# Patient Record
Sex: Female | Born: 1985 | Race: Black or African American | Hispanic: No | Marital: Single | Smoking: Current every day smoker
Health system: Southern US, Community
[De-identification: ages and names within clinical notes are randomized; demographics above are authoritative.]

## PROBLEM LIST (undated history)

## (undated) DIAGNOSIS — M25552 Pain in left hip: Secondary | ICD-10-CM

## (undated) DIAGNOSIS — G8929 Other chronic pain: Secondary | ICD-10-CM

## (undated) DIAGNOSIS — K029 Dental caries, unspecified: Secondary | ICD-10-CM

## (undated) DIAGNOSIS — F419 Anxiety disorder, unspecified: Secondary | ICD-10-CM

## (undated) DIAGNOSIS — M549 Dorsalgia, unspecified: Secondary | ICD-10-CM

## (undated) DIAGNOSIS — I10 Essential (primary) hypertension: Secondary | ICD-10-CM

## (undated) HISTORY — DX: Pain in left hip: M25.552

## (undated) HISTORY — DX: Other chronic pain: G89.29

## (undated) HISTORY — PX: TUBAL LIGATION: SHX77

## (undated) HISTORY — DX: Dorsalgia, unspecified: M54.9

## (undated) HISTORY — DX: Dental caries, unspecified: K02.9

## (undated) HISTORY — DX: Anxiety disorder, unspecified: F41.9

---

## 2017-06-10 ENCOUNTER — Emergency Department (HOSPITAL_COMMUNITY)
Admission: EM | Admit: 2017-06-10 | Discharge: 2017-06-10 | Disposition: A | Discharge status: Home / Self Care | Payer: Self-pay | Attending: Emergency Medicine | Admitting: Emergency Medicine

## 2017-06-10 ENCOUNTER — Encounter (HOSPITAL_COMMUNITY): Payer: Self-pay | Admitting: Emergency Medicine

## 2017-06-10 DIAGNOSIS — F17210 Nicotine dependence, cigarettes, uncomplicated: Secondary | ICD-10-CM | POA: Insufficient documentation

## 2017-06-10 DIAGNOSIS — M5416 Radiculopathy, lumbar region: Secondary | ICD-10-CM | POA: Insufficient documentation

## 2017-06-10 MED ORDER — CYCLOBENZAPRINE HCL 10 MG PO TABS: 10.0000 mg | ORAL_TABLET | Freq: Two times a day (BID) | ORAL | 0 refills | Status: DC | PRN

## 2017-06-10 MED ORDER — CYCLOBENZAPRINE HCL 10 MG PO TABS
10.0000 mg | ORAL_TABLET | Freq: Once | ORAL | Status: AC
  Administered 2017-06-10: 10 mg via ORAL
  Filled 2017-06-10: qty 1

## 2017-06-10 MED ORDER — PREDNISONE 20 MG PO TABS: 40.0000 mg | ORAL_TABLET | Freq: Every day | ORAL | 0 refills | Status: DC

## 2017-06-10 NOTE — ED Triage Notes (Signed)
C/o R upper leg pain since yesterday.  No known injury.  Denies swelling or any other symptoms.

## 2017-06-10 NOTE — ED Provider Notes (Signed)
[facility]-EMERGENCY DEPT Provider Note   CSN: [id number] Arrival date & time: 06/10/17  [id number]     History   Chief Complaint Chief Complaint  Patient presents with  . Leg Pain    HPI Destiny LawsStephanie Cook is a 31 y.o. female.  Patient presents to the emergency department with chief complaint of low back pain that radiates to her bilateral hips and thighs. She states that she has had the symptoms intermittently for a while, but states that symptoms recently worsened yesterday. She denies any fevers chills. Denies any new injuries. She denies any bowel or bladder incontinence. As a numbness, weakness, or tingling. Denies any IV drug use. Denies any history of cancer. There are no other associated symptoms.   The history is provided by the patient. No language interpreter was used.    History reviewed. No pertinent past medical history.  There are no active problems to display for this patient.   Past Surgical History:  Procedure Laterality Date  . CESAREAN SECTION      OB History    No data available       Home Medications    Prior to Admission medications   Medication Sig Start Date End Date Taking? Authorizing Provider  cyclobenzaprine (FLEXERIL) 10 MG tablet Take 1 tablet (10 mg total) by mouth 2 (two) times daily as needed for muscle spasms. 06/10/17   Roxy HorsemanBrowning, Kesa Birky, PA-C  predniSONE (DELTASONE) 20 MG tablet Take 2 tablets (40 mg total) by mouth daily. 06/10/17   Roxy HorsemanBrowning, Katiana Ruland, PA-C    Family History No family history on file.  Social History Social History  Substance Use Topics  . Smoking status: Current Every Day Smoker  . Smokeless tobacco: Never Used  . Alcohol use No     Allergies   Patient has no allergy information on record.   Review of Systems Review of Systems  Constitutional: Negative for chills and fever.  Gastrointestinal:       No bowel incontinence  Genitourinary:       No urinary incontinence  Musculoskeletal: Positive for arthralgias,  back pain and myalgias.  Neurological:       No saddle anesthesia     Physical Exam Updated Vital Signs BP 114/72 (BP Location: Left Arm)   Pulse 94   Temp 98.5 F (36.9 C) (Oral)   Resp 18   LMP 05/20/2017   SpO2 100%   Physical Exam Physical Exam  Constitutional: Pt appears well-developed and well-nourished. No distress.  HENT:  Head: Normocephalic and atraumatic.  Mouth/Throat: Oropharynx is clear and moist. No oropharyngeal exudate.  Eyes: Conjunctivae are normal.  Neck: Normal range of motion. Neck supple.  No meningismus Cardiovascular: Normal rate, regular rhythm and intact distal pulses.   Pulmonary/Chest: Effort normal and breath sounds normal. No respiratory distress. Pt has no wheezes.  Abdominal: Pt exhibits no distension Musculoskeletal:  Lumbar paraspinal muscles tender to palpation, no bony CTLS spine tenderness, deformity, step-off, or crepitus Lymphadenopathy: Pt has no cervical adenopathy.  Neurological: Pt is alert and oriented Speech is clear and goal oriented, follows commands Normal 5/5 strength in upper and lower extremities bilaterally including dorsiflexion and plantar flexion, strong and equal grip strength Sensation intact Great toe extension intact Moves extremities without ataxia, coordination intact Normal gait Normal balance Skin: Skin is warm and dry. No rash noted. Pt is not diaphoretic. No erythema.  Psychiatric: Pt has a normal mood and affect. Behavior is normal.  Nursing note and vitals reviewed.   ED Treatments /  Results  Labs (all labs ordered are listed, but only abnormal results are displayed) Labs Reviewed - No data to display  EKG  EKG Interpretation None       Radiology No results found.  Procedures Procedures (including critical care time)  Medications Ordered in ED Medications  cyclobenzaprine (FLEXERIL) tablet 10 mg (10 mg Oral Given 06/10/17 0547)     Initial Impression / Assessment and Plan / ED Course   I have reviewed the triage vital signs and the nursing notes.  Pertinent labs & imaging results that were available during my care of the patient were reviewed by me and considered in my medical decision making (see chart for details).     Patient with back pain.  No neurological deficits and normal neuro exam.  Patient is ambulatory.  No loss of bowel or bladder control.  Doubt cauda equina.  Denies fever,  doubt epidural abscess or other lesion. Recommend back exercises, stretching, RICE, and will treat with a short course of flexeril and prednisone.  Encouraged the patient that there could be a need for additional workup and/or imaging such as MRI, if the symptoms do not resolve. Patient advised that if the back pain does not resolve, or radiates, this could progress to more serious conditions and is encouraged to follow-up with PCP or orthopedics within 2 weeks.     Final Clinical Impressions(s) / ED Diagnoses   Final diagnoses:  Lumbar radiculopathy    New Prescriptions New Prescriptions   CYCLOBENZAPRINE (FLEXERIL) 10 MG TABLET    Take 1 tablet (10 mg total) by mouth 2 (two) times daily as needed for muscle spasms.   PREDNISONE (DELTASONE) 20 MG TABLET    Take 2 tablets (40 mg total) by mouth daily.     Roxy Horseman, PA-C 06/10/17 0554    Melene Plan, DO 06/10/17 [phone]

## 2017-11-18 ENCOUNTER — Encounter (HOSPITAL_COMMUNITY): Payer: Self-pay | Admitting: Emergency Medicine

## 2017-11-18 ENCOUNTER — Other Ambulatory Visit: Payer: Self-pay

## 2017-11-18 ENCOUNTER — Emergency Department (HOSPITAL_COMMUNITY)
Admission: EM | Admit: 2017-11-18 | Discharge: 2017-11-18 | Disposition: A | Discharge status: Home / Self Care | Payer: BLUE CROSS/BLUE SHIELD | Source: Home / Self Care

## 2017-11-18 ENCOUNTER — Emergency Department (HOSPITAL_COMMUNITY)
Admission: EM | Admit: 2017-11-18 | Discharge: 2017-11-18 | Disposition: A | Discharge status: Home / Self Care | Payer: BLUE CROSS/BLUE SHIELD | Attending: Emergency Medicine | Admitting: Emergency Medicine

## 2017-11-18 DIAGNOSIS — Y33XXXA Other specified events, undetermined intent, initial encounter: Secondary | ICD-10-CM | POA: Diagnosis not present

## 2017-11-18 DIAGNOSIS — R102 Pelvic and perineal pain: Secondary | ICD-10-CM | POA: Insufficient documentation

## 2017-11-18 DIAGNOSIS — T192XXA Foreign body in vulva and vagina, initial encounter: Secondary | ICD-10-CM | POA: Insufficient documentation

## 2017-11-18 DIAGNOSIS — Y939 Activity, unspecified: Secondary | ICD-10-CM | POA: Diagnosis not present

## 2017-11-18 DIAGNOSIS — Y929 Unspecified place or not applicable: Secondary | ICD-10-CM | POA: Diagnosis not present

## 2017-11-18 DIAGNOSIS — Z5321 Procedure and treatment not carried out due to patient leaving prior to being seen by health care provider: Secondary | ICD-10-CM | POA: Diagnosis not present

## 2017-11-18 DIAGNOSIS — Y998 Other external cause status: Secondary | ICD-10-CM | POA: Diagnosis not present

## 2017-11-18 LAB — URINALYSIS, ROUTINE W REFLEX MICROSCOPIC
Bilirubin Urine: NEGATIVE
Glucose, UA: NEGATIVE mg/dL
Ketones, ur: NEGATIVE mg/dL
Leukocytes, UA: NEGATIVE
NITRITE: NEGATIVE
Protein, ur: NEGATIVE mg/dL
SPECIFIC GRAVITY, URINE: 1.018 (ref 1.005–1.030)
pH: 5 (ref 5.0–8.0)

## 2017-11-18 LAB — CBC
HCT: 38.4 % (ref 36.0–46.0)
Hemoglobin: 12.8 g/dL (ref 12.0–15.0)
MCH: 27.2 pg (ref 26.0–34.0)
MCHC: 33.3 g/dL (ref 30.0–36.0)
MCV: 81.5 fL (ref 78.0–100.0)
PLATELETS: 281 10*3/uL (ref 150–400)
RBC: 4.71 MIL/uL (ref 3.87–5.11)
RDW: 13.9 % (ref 11.5–15.5)
WBC: 8.4 10*3/uL (ref 4.0–10.5)

## 2017-11-18 LAB — BASIC METABOLIC PANEL
ANION GAP: 7 (ref 5–15)
BUN: 6 mg/dL (ref 6–20)
CALCIUM: 8.8 mg/dL — AB (ref 8.9–10.3)
CO2: 22 mmol/L (ref 22–32)
Chloride: 108 mmol/L (ref 101–111)
Creatinine, Ser: 0.85 mg/dL (ref 0.44–1.00)
GFR calc Af Amer: >60 mL/min (ref >60)
GLUCOSE: 105 mg/dL — AB (ref 65–99)
POTASSIUM: 3.6 mmol/L (ref 3.5–5.1)
SODIUM: 137 mmol/L (ref 135–145)

## 2017-11-18 LAB — I-STAT BETA HCG BLOOD, ED (MC, WL, AP ONLY)

## 2017-11-18 LAB — PREGNANCY, URINE: PREG TEST UR: NEGATIVE

## 2017-11-18 MED ORDER — OXYCODONE-ACETAMINOPHEN 5-325 MG PO TABS
1.0000 | ORAL_TABLET | ORAL | Status: DC | PRN
  Administered 2017-11-18: 1 via ORAL
  Filled 2017-11-18: qty 1

## 2017-11-18 NOTE — ED Notes (Signed)
Pt thinks she has a tampon in her vagina because she has some reddish, pinkish discharge, some bleeding, and she is not on her period anymore.

## 2017-11-18 NOTE — ED Triage Notes (Signed)
Reports last period a week or so ago.  Now having vaginal pain and thinks there is a tampon left inside.  Reports using a douche and noting what looks like tissue paper coming out.  Also c/o rectal pain.

## 2017-11-18 NOTE — ED Notes (Signed)
Pt LWBS 

## 2017-11-18 NOTE — ED Notes (Signed)
Pt is at [place] ed 

## 2017-11-18 NOTE — ED Triage Notes (Addendum)
Patient is complaining of vaginal pain. Patient thinks she has a tampon still up in her per patient. Patient states she has bumps on her vagina. She also states her booty hurts.

## 2017-11-19 ENCOUNTER — Encounter (HOSPITAL_COMMUNITY): Payer: Self-pay | Admitting: Emergency Medicine

## 2017-11-19 ENCOUNTER — Emergency Department (HOSPITAL_COMMUNITY)
Admission: EM | Admit: 2017-11-19 | Discharge: 2017-11-19 | Disposition: A | Discharge status: Home / Self Care | Payer: BLUE CROSS/BLUE SHIELD | Attending: Emergency Medicine | Admitting: Emergency Medicine

## 2017-11-19 ENCOUNTER — Other Ambulatory Visit: Payer: Self-pay

## 2017-11-19 DIAGNOSIS — F17200 Nicotine dependence, unspecified, uncomplicated: Secondary | ICD-10-CM | POA: Diagnosis not present

## 2017-11-19 DIAGNOSIS — N938 Other specified abnormal uterine and vaginal bleeding: Secondary | ICD-10-CM | POA: Diagnosis not present

## 2017-11-19 DIAGNOSIS — Z79899 Other long term (current) drug therapy: Secondary | ICD-10-CM | POA: Diagnosis not present

## 2017-11-19 DIAGNOSIS — N899 Noninflammatory disorder of vagina, unspecified: Secondary | ICD-10-CM | POA: Diagnosis present

## 2017-11-19 LAB — URINALYSIS, ROUTINE W REFLEX MICROSCOPIC
BACTERIA UA: NONE SEEN
Bilirubin Urine: NEGATIVE
Glucose, UA: NEGATIVE mg/dL
Ketones, ur: NEGATIVE mg/dL
Leukocytes, UA: NEGATIVE
Nitrite: NEGATIVE
PROTEIN: 100 mg/dL — AB
Specific Gravity, Urine: 1.019 (ref 1.005–1.030)
WBC UA: NONE SEEN WBC/hpf (ref 0–5)
pH: 5 (ref 5.0–8.0)

## 2017-11-19 LAB — WET PREP, GENITAL
Sperm: NONE SEEN
TRICH WET PREP: NONE SEEN
YEAST WET PREP: NONE SEEN

## 2017-11-19 LAB — COMPREHENSIVE METABOLIC PANEL
ALBUMIN: 3.9 g/dL (ref 3.5–5.0)
ALK PHOS: 76 U/L (ref 38–126)
ALT: 14 U/L (ref 14–54)
ANION GAP: 8 (ref 5–15)
AST: 18 U/L (ref 15–41)
BUN: 10 mg/dL (ref 6–20)
CALCIUM: 8.4 mg/dL — AB (ref 8.9–10.3)
CO2: 22 mmol/L (ref 22–32)
Chloride: 106 mmol/L (ref 101–111)
Creatinine, Ser: 0.95 mg/dL (ref 0.44–1.00)
GFR calc Af Amer: >60 mL/min (ref >60)
GFR calc non Af Amer: >60 mL/min (ref >60)
GLUCOSE: 95 mg/dL (ref 65–99)
POTASSIUM: 3.6 mmol/L (ref 3.5–5.1)
SODIUM: 136 mmol/L (ref 135–145)
Total Bilirubin: 0.2 mg/dL — ABNORMAL LOW (ref 0.3–1.2)
Total Protein: 7.2 g/dL (ref 6.5–8.1)

## 2017-11-19 LAB — I-STAT BETA HCG BLOOD, ED (MC, WL, AP ONLY): I-stat hCG, quantitative: <5 m[IU]/mL (ref <5)

## 2017-11-19 LAB — CBC
HEMATOCRIT: 39.1 % (ref 36.0–46.0)
HEMOGLOBIN: 13.3 g/dL (ref 12.0–15.0)
MCH: 27.9 pg (ref 26.0–34.0)
MCHC: 34 g/dL (ref 30.0–36.0)
MCV: 82.1 fL (ref 78.0–100.0)
Platelets: 255 10*3/uL (ref 150–400)
RBC: 4.76 MIL/uL (ref 3.87–5.11)
RDW: 14 % (ref 11.5–15.5)
WBC: 7.8 10*3/uL (ref 4.0–10.5)

## 2017-11-19 LAB — LIPASE, BLOOD: Lipase: 32 U/L (ref 11–51)

## 2017-11-19 MED ORDER — IBUPROFEN 800 MG PO TABS: 800.0000 mg | ORAL_TABLET | Freq: Three times a day (TID) | ORAL | 0 refills | Status: DC | PRN

## 2017-11-19 MED ORDER — IBUPROFEN 400 MG PO TABS
600.0000 mg | ORAL_TABLET | Freq: Once | ORAL | Status: AC
  Administered 2017-11-19: 10:00:00 600 mg via ORAL
  Filled 2017-11-19: qty 1

## 2017-11-19 MED ORDER — OXYCODONE-ACETAMINOPHEN 5-325 MG PO TABS
1.0000 | ORAL_TABLET | ORAL | Status: DC | PRN
  Administered 2017-11-19: 1 via ORAL
  Filled 2017-11-19: qty 1

## 2017-11-19 NOTE — ED Notes (Signed)
Patient at desk wanting an update. This EMT told patient about wait and current status.

## 2017-11-19 NOTE — ED Provider Notes (Signed)
[facility]   CSN: [id number] Arrival date & time: 11/19/17  0200     History   Chief Complaint Chief Complaint  Patient presents with  . Foreign Body in Vagina    HPI Destiny Cook is a 31 y.o. female.  HPI  31 year old female presents with concern for a retained tampon in her vagina.  She states that she had a menstrual cycle from December 20-25.  She states that she douched 2 or 3 days ago and after this had some tissue come out of her vagina.  She has been having pain in her vagina since.  She also has had bleeding.  Some white discharge along with it.  The pain is her lower abdomen/vagina.  She denies any urinary symptoms.  No fevers or vomiting.  History reviewed. No pertinent past medical history.  There are no active problems to display for this patient.   Past Surgical History:  Procedure Laterality Date  . CESAREAN SECTION    . TUBAL LIGATION      OB History    No data available       Home Medications    Prior to Admission medications   Medication Sig Start Date End Date Taking? Authorizing Provider  cyclobenzaprine (FLEXERIL) 10 MG tablet Take 1 tablet (10 mg total) by mouth 2 (two) times daily as needed for muscle spasms. 06/10/17   Roxy HorsemanBrowning, Robert, PA-C  ibuprofen (ADVIL,MOTRIN) 800 MG tablet Take 1 tablet (800 mg total) by mouth every 8 (eight) hours as needed for moderate pain or cramping. 11/19/17   Pricilla LovelessGoldston, Ambree Frances, MD  predniSONE (DELTASONE) 20 MG tablet Take 2 tablets (40 mg total) by mouth daily. 06/10/17   Roxy HorsemanBrowning, Robert, PA-C    Family History History reviewed. No pertinent family history.  Social History Social History   Tobacco Use  . Smoking status: Current Every Day Smoker  . Smokeless tobacco: Never Used  Substance Use Topics  . Alcohol use: No  . Drug use: No     Allergies   Patient has no known allergies.   Review of Systems Review of Systems  Constitutional:  Negative for fever.  Gastrointestinal: Positive for abdominal pain. Negative for vomiting.  Genitourinary: Positive for vaginal bleeding, vaginal discharge and vaginal pain. Negative for dysuria.  All other systems reviewed and are negative.    Physical Exam Updated Vital Signs BP 113/68 (BP Location: Right Arm)   Pulse 97   Temp 98.8 F (37.1 C) (Oral)   Resp 16   Ht 5\' 4"  (1.626 m)   Wt 104.3 kg (230 lb)   LMP 11/09/2017   SpO2 100%   BMI 39.48 kg/m   Physical Exam  Constitutional: She is oriented to person, place, and time. She appears well-developed and well-nourished.  HENT:  Head: Normocephalic and atraumatic.  Right Ear: External ear normal.  Left Ear: External ear normal.  Nose: Nose normal.  Eyes: Right eye exhibits no discharge. Left eye exhibits no discharge.  Cardiovascular: Normal rate, regular rhythm and normal heart sounds.  Pulmonary/Chest: Effort normal and breath sounds normal.  Abdominal: Soft. There is tenderness (mild).    Genitourinary: There is no rash on the right labia. There is no rash on the left labia. Uterus is tender. Cervix exhibits no motion tenderness, no discharge and no friability. There is bleeding in the vagina.  Genitourinary Comments: No foreign body seen in the vagina including no tampon.  There is a small amount of bleeding  coming from the cervical loss.  No lacerations were seen in the vagina.  There is no discharge.  Mild uterine tenderness.  Neurological: She is alert and oriented to person, place, and time.  Skin: Skin is warm and dry.  Nursing note and vitals reviewed.    ED Treatments / Results  Labs (all labs ordered are listed, but only abnormal results are displayed) Labs Reviewed  WET PREP, GENITAL - Abnormal; Notable for the following components:      Result Value   Clue Cells Wet Prep HPF POC PRESENT (*)    WBC, Wet Prep HPF POC FEW (*)    All other components within normal limits  COMPREHENSIVE METABOLIC PANEL -  Abnormal; Notable for the following components:   Calcium 8.4 (*)    Total Bilirubin 0.2 (*)    All other components within normal limits  URINALYSIS, ROUTINE W REFLEX MICROSCOPIC - Abnormal; Notable for the following components:   APPearance HAZY (*)    Hgb urine dipstick LARGE (*)    Protein, ur 100 (*)    Squamous Epithelial / LPF 0-5 (*)    All other components within normal limits  LIPASE, BLOOD  CBC  I-STAT BETA HCG BLOOD, ED ([facility], [facility], [facility] ONLY)  GC/CHLAMYDIA PROBE AMP ([place]) NOT [facility]    EKG  EKG Interpretation None       Radiology No results found.  Procedures Procedures (including critical care time)  Medications Ordered in ED Medications  oxyCODONE-acetaminophen (PERCOCET/ROXICET) 5-325 MG per tablet 1 tablet (1 tablet Oral Given 11/19/17 0240)  ibuprofen (ADVIL,MOTRIN) tablet 600 mg (600 mg Oral Given 11/19/17 0940)     Initial Impression / Assessment and Plan / ED Course  I have reviewed the triage vital signs and the nursing notes.  Pertinent labs & imaging results that were available during my care of the patient were reviewed by me and considered in my medical decision making (see chart for details).     No retained foreign body seen.  Lab work obtained while she was in triage is unremarkable besides blood in her urine.  Her hemoglobin is normal.  She is not pregnant.  She has some mild uterine tenderness.  Given no foreign bodies or laceration seen, as well as seeing her vaginal bleeding come from her cervical loss, this appears to be dysfunctional uterine bleeding.  Discussed using NSAIDs and following up with OB/GYN. She has some clue cells on wet prep but has only mild discharge without foul or fishy odor. I doubt this is truly BV. REturn precautions.   Final Clinical Impressions(s) / ED Diagnoses   Final diagnoses:  Dysfunctional uterine bleeding    ED Discharge Orders        Ordered    ibuprofen (ADVIL,MOTRIN) 800 MG tablet  Every 8  hours PRN     11/19/17 1002       Pricilla LovelessGoldston, [place], MD 11/19/17 1021

## 2017-11-19 NOTE — ED Triage Notes (Signed)
Patient arrives with complaint of possible tampon lodged in vagina. States she was spotting yesterday and today she has begun to bleeding more. LMP: 12/25.

## 2017-11-19 NOTE — ED Notes (Signed)
Pt states she undertands instructions. Home stable with steady gait.

## 2017-11-19 NOTE — ED Notes (Signed)
Pt ambulates to room 38 with steady gait.

## 2017-11-20 LAB — GC/CHLAMYDIA PROBE AMP (~~LOC~~) NOT AT ARMC
CHLAMYDIA, DNA PROBE: NEGATIVE
Neisseria Gonorrhea: NEGATIVE

## 2018-11-26 ENCOUNTER — Encounter (HOSPITAL_BASED_OUTPATIENT_CLINIC_OR_DEPARTMENT_OTHER): Payer: Self-pay | Admitting: Emergency Medicine

## 2018-11-26 ENCOUNTER — Emergency Department (HOSPITAL_BASED_OUTPATIENT_CLINIC_OR_DEPARTMENT_OTHER)
Admission: EM | Admit: 2018-11-26 | Discharge: 2018-11-26 | Disposition: A | Discharge status: Home / Self Care | Payer: BLUE CROSS/BLUE SHIELD | Attending: Emergency Medicine | Admitting: Emergency Medicine

## 2018-11-26 ENCOUNTER — Emergency Department (HOSPITAL_BASED_OUTPATIENT_CLINIC_OR_DEPARTMENT_OTHER): Payer: BLUE CROSS/BLUE SHIELD

## 2018-11-26 ENCOUNTER — Other Ambulatory Visit: Payer: Self-pay

## 2018-11-26 DIAGNOSIS — M25562 Pain in left knee: Secondary | ICD-10-CM | POA: Diagnosis not present

## 2018-11-26 DIAGNOSIS — M25552 Pain in left hip: Secondary | ICD-10-CM | POA: Diagnosis present

## 2018-11-26 DIAGNOSIS — F17200 Nicotine dependence, unspecified, uncomplicated: Secondary | ICD-10-CM | POA: Diagnosis not present

## 2018-11-26 LAB — PREGNANCY, URINE: PREG TEST UR: NEGATIVE

## 2018-11-26 MED ORDER — HYDROCODONE-ACETAMINOPHEN 5-325 MG PO TABS
1.0000 | ORAL_TABLET | Freq: Once | ORAL | Status: AC
  Administered 2018-11-26: 1 via ORAL
  Filled 2018-11-26: qty 1

## 2018-11-26 NOTE — ED Triage Notes (Signed)
Reports left leg injury today after falling down steps.  Unable to locate on specific location.  States "my whole leg hurts".

## 2018-11-26 NOTE — Discharge Instructions (Addendum)
Evaluated today for knee pain after fall.  Your x-rays were negative.  We have placed you in a knee sleeve and given you crutches.  Please make sure to ice, elevate and rest your leg.  You may take Tylenol and ibuprofen as needed for your pain.  Follow-up with your PCP for reevaluation if you continue to have pain.

## 2018-11-26 NOTE — ED Provider Notes (Signed)
[facility] EMERGENCY DEPARTMENT Provider Note   CSN: [id number] Arrival date & time: 11/26/18  1841   History   Chief Complaint Chief Complaint  Patient presents with  . Leg Injury    HPI Destiny Cook is a 33 y.o. female with no significant past medical history who presents for evaluation of left leg pain.  Patient states she was walking up the stairs when her left foot caught the lip of the stairs and she fell on her left side.  Patient states she was able to ambulate to work this morning after the incident occurred.  Has had pain to her left hip and left knee since incident.  Rates her pain an 8/10.  Denies radiation of pain, fever, chills, nausea, vomiting, low back pain, numbness or tingling in her extremities, decreased range of motion, swelling, warmth or redness. Has not take anything for symptoms PTA.  Denies additional aggravating or alleviating factors.  Denies hitting her head or loss of consciousness.  Did not have any preceding dizziness or lightheadedness.  History provided by patient.  No interpreter was used.  HPI  History reviewed. No pertinent past medical history.  There are no active problems to display for this patient.   Past Surgical History:  Procedure Laterality Date  . CESAREAN SECTION    . TUBAL LIGATION       OB History   No obstetric history on file.      Home Medications    Prior to Admission medications   Medication Sig Start Date End Date Taking? Authorizing Provider  cyclobenzaprine (FLEXERIL) 10 MG tablet Take 1 tablet (10 mg total) by mouth 2 (two) times daily as needed for muscle spasms. 06/10/17   Roxy HorsemanBrowning, Robert, PA-C  ibuprofen (ADVIL,MOTRIN) 800 MG tablet Take 1 tablet (800 mg total) by mouth every 8 (eight) hours as needed for moderate pain or cramping. 11/19/17   Pricilla LovelessGoldston, Scott, MD  predniSONE (DELTASONE) 20 MG tablet Take 2 tablets (40 mg total) by mouth daily. 06/10/17   Roxy HorsemanBrowning, Robert, PA-C    Family  History History reviewed. No pertinent family history.  Social History Social History   Tobacco Use  . Smoking status: Current Every Day Smoker  . Smokeless tobacco: Never Used  Substance Use Topics  . Alcohol use: No  . Drug use: No     Allergies   Patient has no known allergies.   Review of Systems Review of Systems  Constitutional: Negative.   HENT: Negative.   Respiratory: Negative.   Cardiovascular: Negative.   Gastrointestinal: Negative.   Genitourinary: Negative.   Musculoskeletal: Negative for back pain, gait problem, joint swelling, neck pain and neck stiffness.       Left hip and left knee pain.  Skin: Negative.   Neurological: Negative.   All other systems reviewed and are negative.    Physical Exam Updated Vital Signs BP (!) 151/87   Pulse (!) 111   Temp 98.2 F (36.8 C) (Oral)   Resp 16   Ht 5\' 3"  (1.6 m)   Wt 108.9 kg   LMP 11/12/2018 (Approximate) Comment: pregnancy test performed  SpO2 100%   BMI 42.51 kg/m   Physical Exam Vitals signs and nursing note reviewed.  Constitutional:      General: She is not in acute distress.    Appearance: She is well-developed. She is not ill-appearing, toxic-appearing or diaphoretic.  HENT:     Head: Normocephalic and atraumatic.     Nose: Nose normal. No congestion  or rhinorrhea.     Mouth/Throat:     Mouth: Mucous membranes are moist.  Eyes:     Pupils: Pupils are equal, round, and reactive to light.  Neck:     Musculoskeletal: Normal range of motion.  Cardiovascular:     Rate and Rhythm: Normal rate.  Pulmonary:     Effort: No respiratory distress.  Abdominal:     General: There is no distension.  Musculoskeletal: Normal range of motion.     Comments: No tenderness to palpation over bilateral hips or pelvis.  She does have mild tenderness palpation over lateral medial joint lines.  No tenderness to patella.  Patient with full range of motion bilateral lower extremities without difficulty.   Negative varus, valgus stress.  Negative anterior drawer to the left knee.  No bony tenderness to tibia, fibula.  No midline lower back pain.  Full range of motion to lumbar spine. Lower Extremity compartments are soft.  Skin:    General: Skin is warm and dry.     Comments: No edema, erythema, ecchymosis or warmth.  Neurological:     Mental Status: She is alert.     Comments: 5/5 strength to bilateral lower extremities.  Able to ambulate in department, however has pain.      ED Treatments / Results  Labs (all labs ordered are listed, but only abnormal results are displayed) Labs Reviewed  PREGNANCY, URINE    EKG None  Radiology Dg Knee Complete 4 Views Left  Result Date: 11/26/2018 CLINICAL DATA:  Post fall down 7 steps this morning now with left knee pain. EXAM: LEFT KNEE - COMPLETE 4+ VIEW COMPARISON:  None. FINDINGS: No fracture or dislocation. Joint spaces are preserved. No evidence of chondrocalcinosis. No definite knee joint effusion. Regional soft tissues appear normal. IMPRESSION: No explanation for patient's left knee pain. Electronically Signed   By: Simonne Come M.D.   On: 11/26/2018 20:17   Dg Hip Unilat W Or Wo Pelvis 2-3 Views Left  Result Date: 11/26/2018 CLINICAL DATA:  Post fall down 7 steps now with left hip pain. EXAM: DG HIP (WITH OR WITHOUT PELVIS) 2-3V LEFT COMPARISON:  None. FINDINGS: No fracture or dislocation. 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 Limited visualization of the pelvis is normal. Suspected similar-appearing mild degenerative change of the contralateral right hip, incompletely evaluated. Phleboliths overlie the lower pelvis bilaterally. Linear radiopaque structure overlying the sacrum may be external to the patient. IMPRESSION: 1. No acute findings. 2. 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 Electronically Signed   By: Simonne Come M.D.   On: 11/26/2018 20:18    Procedures Procedures (including critical  care time)  Medications Ordered in ED Medications  HYDROcodone-acetaminophen (NORCO/VICODIN) 5-325 MG per tablet 1 tablet (1 tablet Oral Given 11/26/18 1948)     Initial Impression / Assessment and Plan / ED Course  I have reviewed the triage vital signs and the nursing notes.  Pertinent labs & imaging results that were available during my care of the patient were reviewed by me and considered in my medical decision making (see chart for details).  33 year old female presents for evaluation after mechanical fall.  Pain to left hip and left knee.  Normal musculoskeletal exam.  Neurovascularly intact.  She does have mild tenderness to lateral and medial joint lines, however has negative varus, valgus stress.  Negative anterior drawer sign.  Able to ambulate after incident.  Has been able to ambulate department without  difficulty.  No edema, erythema, ecchymosis or warmth.  No midline back pain.  Full range of motion to lumbar spine.  Did not hit head or lose conscious when she fell.  Extremity compartments are soft.  Will obtain plain film left hip and left knee and reevaluate.  Plain film negative for fracture or dislocation.  No suspicion for occult fracture.  Patient most likely with musculoskeletal sprain, strain.  Will place patient in knee sleeve and provide crutches.  Low suspicion for emergent pathology causing patient's symptoms at this time.  Patient is hemodynamically stable and appropriate for DC home at this time.  Will have patient follow-up with PCP for reevaluation if she continues to have symptoms.  Discussed RICE for symptomatic management.  Patient voiced understanding of return precautions and is agreeable for follow-up.    Final Clinical Impressions(s) / ED Diagnoses   Final diagnoses:  Left hip pain  Acute pain of left knee    ED Discharge Orders    None       Shavana Calder A, PA-C 11/27/18 0013    Jacalyn Lefevre, MD 11/27/18 0018

## 2018-12-02 ENCOUNTER — Encounter (HOSPITAL_COMMUNITY): Payer: Self-pay | Admitting: Emergency Medicine

## 2018-12-02 ENCOUNTER — Other Ambulatory Visit: Payer: Self-pay

## 2018-12-02 ENCOUNTER — Emergency Department (HOSPITAL_COMMUNITY)
Admission: EM | Admit: 2018-12-02 | Discharge: 2018-12-02 | Disposition: A | Discharge status: Home / Self Care | Payer: BLUE CROSS/BLUE SHIELD | Attending: Emergency Medicine | Admitting: Emergency Medicine

## 2018-12-02 DIAGNOSIS — F17200 Nicotine dependence, unspecified, uncomplicated: Secondary | ICD-10-CM | POA: Insufficient documentation

## 2018-12-02 DIAGNOSIS — K0889 Other specified disorders of teeth and supporting structures: Secondary | ICD-10-CM | POA: Diagnosis present

## 2018-12-02 DIAGNOSIS — K029 Dental caries, unspecified: Secondary | ICD-10-CM | POA: Diagnosis not present

## 2018-12-02 MED ORDER — MELOXICAM 7.5 MG PO TABS: 7.5000 mg | ORAL_TABLET | Freq: Every day | ORAL | 0 refills | Status: AC

## 2018-12-02 MED ORDER — PENICILLIN V POTASSIUM 500 MG PO TABS: 500.0000 mg | ORAL_TABLET | Freq: Four times a day (QID) | ORAL | 0 refills | Status: AC

## 2018-12-02 NOTE — ED Triage Notes (Signed)
Pt. Stated, I started having a toothache 2 days ago.

## 2018-12-02 NOTE — ED Notes (Signed)
Pt. Refused to sign her departure signature.

## 2018-12-02 NOTE — Discharge Instructions (Addendum)
Take penicillin as prescribed and complete the full course. Take meloxicam as needed as prescribed for pain. Follow-up with a dentist as soon as possible. Continue to brush teeth with a soft toothbrush, rinse and spit with Listerine or salt water after every meal.

## 2018-12-02 NOTE — ED Provider Notes (Signed)
[facility] EMERGENCY DEPARTMENT Provider Note   CSN: [id number] Arrival date & time: 12/02/18  1111     History   Chief Complaint Chief Complaint  Patient presents with  . Dental Pain    HPI Destiny Cook is a 33 y.o. female.  33yo female presents with complaint of right lower tooth pain x 2 days.  Denies trauma, drainage, fevers.  Patient has known dental disease.  No other complaints or concerns.     History reviewed. No pertinent past medical history.  There are no active problems to display for this patient.   Past Surgical History:  Procedure Laterality Date  . CESAREAN SECTION    . TUBAL LIGATION       OB History   No obstetric history on file.      Home Medications    Prior to Admission medications   Medication Sig Start Date End Date Taking? Authorizing Provider  meloxicam (MOBIC) 7.5 MG tablet Take 1 tablet (7.5 mg total) by mouth daily for 10 days. 12/02/18 12/12/18  Army Melia A, PA-C  penicillin v potassium (VEETID) 500 MG tablet Take 1 tablet (500 mg total) by mouth 4 (four) times daily for 7 days. 12/02/18 12/09/18  Jeannie Fend, PA-C    Family History No family history on file.  Social History Social History   Tobacco Use  . Smoking status: Current Every Day Smoker  . Smokeless tobacco: Never Used  Substance Use Topics  . Alcohol use: No  . Drug use: No     Allergies   Patient has no known allergies.   Review of Systems Review of Systems  Constitutional: Negative for chills and fever.  HENT: Positive for dental problem. Negative for facial swelling, trouble swallowing and voice change.   Gastrointestinal: Negative for vomiting.  Musculoskeletal: Negative for neck pain and neck stiffness.  Skin: Negative for rash and wound.  Allergic/Immunologic: Negative for immunocompromised state.  Neurological: Negative for headaches.  Hematological: Negative for adenopathy.  Psychiatric/Behavioral: Negative for  confusion.  All other systems reviewed and are negative.    Physical Exam Updated Vital Signs BP 124/65 (BP Location: Right Arm)   Pulse 100   Temp 98.4 F (36.9 C) (Oral)   Resp 18   Ht 5\' 3"  (1.6 m)   Wt 108.8 kg   LMP 11/12/2018 (Approximate) Comment: pregnancy test performed  SpO2 98%   BMI 42.49 kg/m   Physical Exam Vitals signs and nursing note reviewed.  Constitutional:      General: She is not in acute distress.    Appearance: She is well-developed. She is not diaphoretic.  HENT:     Head: Normocephalic and atraumatic.     Nose: Nose normal.     Mouth/Throat:     Mouth: Mucous membranes are moist.     Pharynx: No oropharyngeal exudate or posterior oropharyngeal erythema.   Neck:     Musculoskeletal: Neck supple.  Pulmonary:     Effort: Pulmonary effort is normal.  Lymphadenopathy:     Cervical: No cervical adenopathy.  Skin:    General: Skin is warm and dry.     Findings: No erythema or rash.  Neurological:     Mental Status: She is alert and oriented to person, place, and time.  Psychiatric:        Behavior: Behavior normal.      ED Treatments / Results  Labs (all labs ordered are listed, but only abnormal results are displayed) Labs Reviewed -  No data to display  EKG None  Radiology No results found.  Procedures Procedures (including critical care time)  Medications Ordered in ED Medications - No data to display   Initial Impression / Assessment and Plan / ED Course  I have reviewed the triage vital signs and the nursing notes.  Pertinent labs & imaging results that were available during my care of the patient were reviewed by me and considered in my medical decision making (see chart for details).    Final Clinical Impressions(s) / ED Diagnoses   Final diagnoses:  Dental caries    ED Discharge Orders         Ordered    penicillin v potassium (VEETID) 500 MG tablet  4 times daily     12/02/18 1128    meloxicam (MOBIC) 7.5 MG  tablet  Daily     12/02/18 1128           Alden HippMurphy,  A, PA-C 12/02/18 1131    Sabas SousBero, Michael M, MD 12/03/18 1040

## 2018-12-13 ENCOUNTER — Emergency Department (HOSPITAL_BASED_OUTPATIENT_CLINIC_OR_DEPARTMENT_OTHER)
Admission: EM | Admit: 2018-12-13 | Discharge: 2018-12-13 | Disposition: A | Discharge status: Home / Self Care | Payer: BLUE CROSS/BLUE SHIELD | Attending: Emergency Medicine | Admitting: Emergency Medicine

## 2018-12-13 ENCOUNTER — Other Ambulatory Visit: Payer: Self-pay

## 2018-12-13 ENCOUNTER — Encounter (HOSPITAL_BASED_OUTPATIENT_CLINIC_OR_DEPARTMENT_OTHER): Payer: Self-pay | Admitting: Emergency Medicine

## 2018-12-13 DIAGNOSIS — F17200 Nicotine dependence, unspecified, uncomplicated: Secondary | ICD-10-CM | POA: Diagnosis not present

## 2018-12-13 DIAGNOSIS — K0889 Other specified disorders of teeth and supporting structures: Secondary | ICD-10-CM

## 2018-12-13 DIAGNOSIS — K029 Dental caries, unspecified: Secondary | ICD-10-CM

## 2018-12-13 MED ORDER — NAPROXEN 375 MG PO TABS: 375.0000 mg | ORAL_TABLET | Freq: Two times a day (BID) | ORAL | 0 refills | Status: DC

## 2018-12-13 MED ORDER — HYDROCODONE-ACETAMINOPHEN 5-325 MG PO TABS
2.0000 | ORAL_TABLET | Freq: Once | ORAL | Status: AC
  Administered 2018-12-13: 2 via ORAL
  Filled 2018-12-13: qty 2

## 2018-12-13 MED ORDER — AMOXICILLIN-POT CLAVULANATE 875-125 MG PO TABS: 1.0000 | ORAL_TABLET | Freq: Two times a day (BID) | ORAL | 0 refills | Status: AC

## 2018-12-13 NOTE — ED Provider Notes (Signed)
[facility] EMERGENCY DEPARTMENT Provider Note   CSN: [id number] Arrival date & time: 12/13/18  2119     History   Chief Complaint Chief Complaint  Patient presents with  . Dental Pain    HPI Destiny Cook is a 33 y.o. female for evaluation of right lower dental pain that is been ongoing for last several weeks.  Patient was seen in the 12/2 0 for evaluation of same symptoms.  At that time, she was discharged with penicillin and meloxicam which patient states she has been taking.  Patient reports that she continues to have pain to the right lower teeth.  She states she has taken Tylenol, ibuprofen without any relief from symptoms.  Patient states that she has been able to tolerate secretions, p.o.  She states she feels like her face is swollen but has not noticed any overlying warmth, erythema, edema.  Patient states she has not noted any fever.  Patient reports that she just moved here from [place] and does not have a dentist that she follows up with.  Patient denies any difficulty swallowing, difficulty breathing, vomiting, fevers.  The history is provided by the patient.    History reviewed. No pertinent past medical history.  There are no active problems to display for this patient.   Past Surgical History:  Procedure Laterality Date  . CESAREAN SECTION    . TUBAL LIGATION       OB History   No obstetric history on file.      Home Medications    Prior to Admission medications   Medication Sig Start Date End Date Taking? Authorizing Provider  amoxicillin-clavulanate (AUGMENTIN) 875-125 MG tablet Take 1 tablet by mouth 2 (two) times daily for 7 days. 12/13/18 12/20/18  Maxwell Caul, PA-C  naproxen (NAPROSYN) 375 MG tablet Take 1 tablet (375 mg total) by mouth 2 (two) times daily. 12/13/18   Maxwell Caul, PA-C    Family History History reviewed. No pertinent family history.  Social History Social History   Tobacco Use  . Smoking status: Current  Every Day Smoker  . Smokeless tobacco: Never Used  Substance Use Topics  . Alcohol use: No  . Drug use: No     Allergies   Patient has no known allergies.   Review of Systems Review of Systems  Constitutional: Negative for fever.  HENT: Positive for dental problem and facial swelling. Negative for drooling and trouble swallowing.   Respiratory: Negative for shortness of breath.   Gastrointestinal: Negative for vomiting.  All other systems reviewed and are negative.    Physical Exam Updated Vital Signs BP (!) 132/102 (BP Location: Left Arm)   Pulse 97   Temp 98.2 F (36.8 C) (Oral)   Resp 18   Ht 5\' 3"  (1.6 m)   Wt 104.3 kg   LMP 12/10/2018 (Exact Date)   SpO2 100%   BMI 40.74 kg/m   Physical Exam Vitals signs and nursing note reviewed.  Constitutional:      Appearance: She is well-developed.  HENT:     Head: Normocephalic and atraumatic.     Comments: Face is symmetric in appearance without any overlying warmth, erythema, edema.    Mouth/Throat:     Mouth: Mucous membranes are moist.     Pharynx: Oropharynx is clear. Uvula midline.      Comments: Airway is patent, phonation is intact. Uvula is midline.  No trismus.  Diffuse dental caries noted throughout. Eyes:     General: No  scleral icterus.       Right eye: No discharge.        Left eye: No discharge.     Conjunctiva/sclera: Conjunctivae normal.  Pulmonary:     Effort: Pulmonary effort is normal.  Skin:    General: Skin is warm and dry.  Neurological:     Mental Status: She is alert.  Psychiatric:        Speech: Speech normal.        Behavior: Behavior normal.      ED Treatments / Results  Labs (all labs ordered are listed, but only abnormal results are displayed) Labs Reviewed - No data to display  EKG None  Radiology No results found.  Procedures Procedures (including critical care time)  Medications Ordered in ED Medications  HYDROcodone-acetaminophen (NORCO/VICODIN) 5-325 MG per  tablet 2 tablet (2 tablets Oral Given 12/13/18 2331)     Initial Impression / Assessment and Plan / ED Course  I have reviewed the triage vital signs and the nursing notes.  Pertinent labs & imaging results that were available during my care of the patient were reviewed by me and considered in my medical decision making (see chart for details).     33 y.o. F  presents with a few weeks  of dental pain.  Seen here in the ED previously for same symptoms.  Patient reports she was not provide any dental resources.  Comes today because she continues to have pain.  She states she may has noticed some slight facial swelling but no fevers, difficulty breathing, difficulty tolerating p.o. Vital signs reviewed and stable. Patient is afebrile, non-toxic appearing, sitting comfortably on examination table.  Exam, she has diffuse dental caries noted throughout.  It appears as her right lower wisdom tooth has erupted.  Patient with partially cracked tooth #32 with surrounding gingival decay.  No surrounding face is symmetric in appearance without any swelling.  No obvious abscess that would require drainage here in the ED. Exam not concerning for Ludwig's angina or pharyngeal abscess.  Offered patient dental block here in the ED but she declined.  Will give dose of pain medications here in the emergency department.  Review of her records show that she had been seen previously and was discharged home with penicillin.  We will plan to increase antibiotic to Augmentin as well as send her home on some Naprosyn. Patient instructed to follow-up with dentist referral provided. At this time, patient exhibits no emergent life-threatening condition that require further evaluation in ED or admission. Stable for discharge at this time. Strict return precautions discussed. Patient expresses understanding and agreement to plan.  Portions of this note were generated with Scientist, clinical (histocompatibility and immunogenetics)Dragon dictation software. Dictation errors may occur despite  best attempts at proofreading.  Final Clinical Impressions(s) / ED Diagnoses   Final diagnoses:  Dental caries  Pain, dental    ED Discharge Orders         Ordered    amoxicillin-clavulanate (AUGMENTIN) 875-125 MG tablet  2 times daily     12/13/18 2329    naproxen (NAPROSYN) 375 MG tablet  2 times daily     12/13/18 2329           Rosana HoesLayden,  A, PA-C 12/14/18 0017    Arby BarrettePfeiffer, Marcy, MD 12/17/18 [phone]

## 2018-12-13 NOTE — Discharge Instructions (Signed)
Take antibiotics as directed. Please take all of your antibiotics until finished.  You can take Tylenol or Ibuprofen as directed for pain. You can alternate Tylenol and Ibuprofen every 4 hours. If you take Tylenol at 1pm, then you can take Ibuprofen at 5pm. Then you can take Tylenol again at 9pm.   The exam and treatment you received today has been provided on an emergency basis only. This is not a substitute for complete medical or dental care. If your problem worsens or new symptoms (problems) appear, and you are unable to arrange prompt follow-up care with your dentist, call or return to this location. If you do not have a dentist, please follow-up with one on the list provided  CALL YOUR DENTIST OR RETURN IMMEDIATELY IF you develop a fever, rash, difficulty breathing or swallowing, neck or facial swelling, or other potentially serious concerns.   Please follow-up with one of the dental clinics provided to you below or in your paperwork. Call and tell them you were seen in the Emergency Dept and arrange for an appointment. You may have to call multiple places in order to find a place to be seen.  Dental Assistance If the dentist on-call cannot see you, please use the resources below:   Patients with Medicaid: [facility] [address], [phone], [phone]  If unable to pay, or uninsured, contact [facility] ([phone]) or [facility] ([phone] in [place], [phone] in [place]) to become qualified for the adult dental clinic  Other Low-Cost Community Dental Services: [facility]- [address], [place], [place], [address]    [phone], Ext. [phone]    2nd and 4th Thursday of the month at 6:30am    10 clients each day by appointment, can sometimes see walk-in     patients if someone does not show for an appointment [facility]- [address], [place], [place], [address]    [phone]  [facility]- [address], [place], [place], [address]    [phone]  [facility]- [phone] [facility]- [phone] [facility]- [phone]  

## 2018-12-13 NOTE — ED Notes (Signed)
Pt given dental resource guide. Pt very tearful on assessment.

## 2018-12-13 NOTE — ED Notes (Signed)
Pt unable to voice daily medication on at this time. Pt very tearful and only answering yes/no questions.

## 2018-12-13 NOTE — ED Triage Notes (Signed)
Pt is c/o toothache on the right bottom  Pt states she has been taking antibiotics without any relief

## 2019-03-12 ENCOUNTER — Emergency Department (HOSPITAL_BASED_OUTPATIENT_CLINIC_OR_DEPARTMENT_OTHER): Payer: BLUE CROSS/BLUE SHIELD

## 2019-03-12 ENCOUNTER — Other Ambulatory Visit: Payer: Self-pay

## 2019-03-12 ENCOUNTER — Emergency Department (HOSPITAL_BASED_OUTPATIENT_CLINIC_OR_DEPARTMENT_OTHER)
Admission: EM | Admit: 2019-03-12 | Discharge: 2019-03-12 | Disposition: A | Discharge status: Home / Self Care | Payer: BLUE CROSS/BLUE SHIELD | Attending: Emergency Medicine | Admitting: Emergency Medicine

## 2019-03-12 ENCOUNTER — Encounter (HOSPITAL_BASED_OUTPATIENT_CLINIC_OR_DEPARTMENT_OTHER): Payer: Self-pay | Admitting: Emergency Medicine

## 2019-03-12 DIAGNOSIS — N760 Acute vaginitis: Secondary | ICD-10-CM | POA: Insufficient documentation

## 2019-03-12 DIAGNOSIS — F17200 Nicotine dependence, unspecified, uncomplicated: Secondary | ICD-10-CM | POA: Diagnosis not present

## 2019-03-12 DIAGNOSIS — B9689 Other specified bacterial agents as the cause of diseases classified elsewhere: Secondary | ICD-10-CM | POA: Diagnosis not present

## 2019-03-12 DIAGNOSIS — R1030 Lower abdominal pain, unspecified: Secondary | ICD-10-CM

## 2019-03-12 LAB — URINALYSIS, ROUTINE W REFLEX MICROSCOPIC
Bilirubin Urine: NEGATIVE
Glucose, UA: NEGATIVE mg/dL
Hgb urine dipstick: NEGATIVE
Ketones, ur: NEGATIVE mg/dL
Leukocytes,Ua: NEGATIVE
Nitrite: NEGATIVE
Protein, ur: NEGATIVE mg/dL
Specific Gravity, Urine: 1.015 (ref 1.005–1.030)
pH: 6 (ref 5.0–8.0)

## 2019-03-12 LAB — CBC WITH DIFFERENTIAL/PLATELET
Abs Immature Granulocytes: 0.01 10*3/uL (ref 0.00–0.07)
Basophils Absolute: 0 10*3/uL (ref 0.0–0.1)
Basophils Relative: 1 %
Eosinophils Absolute: 0.3 10*3/uL (ref 0.0–0.5)
Eosinophils Relative: 3 %
HCT: 37.4 % (ref 36.0–46.0)
Hemoglobin: 12.1 g/dL (ref 12.0–15.0)
Immature Granulocytes: 0 %
Lymphocytes Relative: 29 %
Lymphs Abs: 2.2 10*3/uL (ref 0.7–4.0)
MCH: 26.9 pg (ref 26.0–34.0)
MCHC: 32.4 g/dL (ref 30.0–36.0)
MCV: 83.3 fL (ref 80.0–100.0)
Monocytes Absolute: 0.5 10*3/uL (ref 0.1–1.0)
Monocytes Relative: 6 %
Neutro Abs: 4.7 10*3/uL (ref 1.7–7.7)
Neutrophils Relative %: 61 %
Platelets: 248 10*3/uL (ref 150–400)
RBC: 4.49 MIL/uL (ref 3.87–5.11)
RDW: 13.4 % (ref 11.5–15.5)
WBC: 7.7 10*3/uL (ref 4.0–10.5)
nRBC: 0 % (ref 0.0–0.2)

## 2019-03-12 LAB — COMPREHENSIVE METABOLIC PANEL
ALT: 26 U/L (ref 0–44)
AST: 22 U/L (ref 15–41)
Albumin: 3.7 g/dL (ref 3.5–5.0)
Alkaline Phosphatase: 65 U/L (ref 38–126)
Anion gap: 5 (ref 5–15)
BUN: 11 mg/dL (ref 6–20)
CO2: 27 mmol/L (ref 22–32)
Calcium: 9.1 mg/dL (ref 8.9–10.3)
Chloride: 106 mmol/L (ref 98–111)
Creatinine, Ser: 0.79 mg/dL (ref 0.44–1.00)
GFR calc Af Amer: >60 mL/min (ref >60)
GFR calc non Af Amer: >60 mL/min (ref >60)
Glucose, Bld: 104 mg/dL — ABNORMAL HIGH (ref 70–99)
Potassium: 4 mmol/L (ref 3.5–5.1)
Sodium: 138 mmol/L (ref 135–145)
Total Bilirubin: 0.2 mg/dL — ABNORMAL LOW (ref 0.3–1.2)
Total Protein: 6.7 g/dL (ref 6.5–8.1)

## 2019-03-12 LAB — WET PREP, GENITAL
Sperm: NONE SEEN
Trich, Wet Prep: NONE SEEN
Yeast Wet Prep HPF POC: NONE SEEN

## 2019-03-12 LAB — PREGNANCY, URINE: Preg Test, Ur: NEGATIVE

## 2019-03-12 LAB — LIPASE, BLOOD: Lipase: 28 U/L (ref 11–51)

## 2019-03-12 MED ORDER — IOHEXOL 300 MG/ML  SOLN
100.0000 mL | Freq: Once | INTRAMUSCULAR | Status: AC | PRN
  Administered 2019-03-12: 100 mL via INTRAVENOUS

## 2019-03-12 MED ORDER — MORPHINE SULFATE (PF) 4 MG/ML IV SOLN
4.0000 mg | Freq: Once | INTRAVENOUS | Status: DC
  Filled 2019-03-12: qty 1

## 2019-03-12 MED ORDER — METRONIDAZOLE 500 MG PO TABS: 500.0000 mg | ORAL_TABLET | Freq: Two times a day (BID) | ORAL | 0 refills | Status: DC

## 2019-03-12 MED ORDER — KETOROLAC TROMETHAMINE 30 MG/ML IJ SOLN
30.0000 mg | Freq: Once | INTRAMUSCULAR | Status: AC
  Administered 2019-03-12: 30 mg via INTRAVENOUS
  Filled 2019-03-12: qty 1

## 2019-03-12 MED ORDER — SODIUM CHLORIDE 0.9 % IV BOLUS: 1000.0000 mL | Freq: Once | INTRAVENOUS | Status: DC

## 2019-03-12 MED ORDER — ONDANSETRON HCL 4 MG/2ML IJ SOLN
4.0000 mg | Freq: Once | INTRAMUSCULAR | Status: AC
  Administered 2019-03-12: 4 mg via INTRAVENOUS
  Filled 2019-03-12: qty 2

## 2019-03-12 MED ORDER — MORPHINE SULFATE (PF) 4 MG/ML IV SOLN
4.0000 mg | Freq: Once | INTRAVENOUS | Status: AC
  Administered 2019-03-12: 4 mg via INTRAMUSCULAR

## 2019-03-12 NOTE — ED Provider Notes (Signed)
[facility] EMERGENCY DEPARTMENT Provider Note   CSN: [id number] Arrival date & time: 03/12/19  1031    History   Chief Complaint Chief Complaint  Patient presents with   Abdominal Pain    HPI Destiny Cook is a 33 y.o. female.     Destiny Cook is a 33 y.o. female with a history of C-section and tubal ligation, otherwise healthy, who presents to the emergency department for evaluation of lower abdominal pain.  She reports pain has been present for the last 3 days, it is located across her lower abdomen and is worse in the very center.  Pain occasionally she has had some nausea but no associated vomiting.  She has not had any fevers or chills.  No diarrhea, constipation or blood in the stool.  She has had some urinary frequency and mild dysuria has not noticed any hematuria.  No pain over her flanks.  She denies any vaginal discharge or vaginal bleeding, does report she is sexually active and is not on any form of birth control, but has had a tubal ligation.  Last menstrual period was 02/11/2019.  She has not taken anything for pain prior to arrival, no other aggravating or alleviating factors.     No past medical history on file.  There are no active problems to display for this patient.   Past Surgical History:  Procedure Laterality Date   CESAREAN SECTION     TUBAL LIGATION       OB History   No obstetric history on file.      Home Medications    Prior to Admission medications   Not on File    Family History No family history on file.  Social History Social History   Tobacco Use   Smoking status: Current Every Day Smoker   Smokeless tobacco: Never Used  Substance Use Topics   Alcohol use: No   Drug use: No     Allergies   Patient has no known allergies.   Review of Systems Review of Systems  Constitutional: Negative for chills and fever.  HENT: Negative.   Eyes: Negative for visual disturbance.  Respiratory: Negative for  cough and shortness of breath.   Cardiovascular: Negative for chest pain.  Gastrointestinal: Positive for abdominal pain and nausea. Negative for blood in stool, constipation, diarrhea and vomiting.  Genitourinary: Positive for dysuria and frequency. Negative for difficulty urinating, flank pain, pelvic pain, vaginal bleeding and vaginal discharge.  Musculoskeletal: Positive for back pain.  Skin: Negative for color change and rash.  Neurological: Negative for weakness and numbness.     Physical Exam Updated Vital Signs BP (!) 141/79 (BP Location: Right Arm)    Pulse 96    Temp 98.3 F (36.8 C) (Oral)    Resp 20    Ht 5\' 3"  (1.6 m)    Wt 113.4 kg    LMP 02/11/2019    SpO2 100%    BMI 44.29 kg/m   Physical Exam Vitals signs and nursing note reviewed. Exam conducted with a chaperone present.  Constitutional:      General: She is not in acute distress.    Appearance: She is well-developed. She is obese. She is not ill-appearing or diaphoretic.  HENT:     Head: Normocephalic and atraumatic.     Mouth/Throat:     Mouth: Mucous membranes are moist.     Pharynx: Oropharynx is clear.  Eyes:     General:  Right eye: No discharge.        Left eye: No discharge.     Pupils: Pupils are equal, round, and reactive to light.  Neck:     Musculoskeletal: Neck supple.  Cardiovascular:     Rate and Rhythm: Normal rate and regular rhythm.     Heart sounds: Normal heart sounds. No murmur. No friction rub. No gallop.   Pulmonary:     Effort: Pulmonary effort is normal. No respiratory distress.     Breath sounds: Normal breath sounds. No wheezing or rales.     Comments: Respirations equal and unlabored, patient able to speak in full sentences, lungs clear to auscultation bilaterally Abdominal:     General: Abdomen is flat. Bowel sounds are normal. There is no distension.     Palpations: Abdomen is soft. There is no mass.     Tenderness: There is abdominal tenderness in the right lower  quadrant, suprapubic area and left lower quadrant. There is guarding.     Comments: Given is soft, nontender, bowel sounds present throughout, there is tenderness across the lower abdomen with some slight guarding in the suprapubic region, no rebound tenderness.  No focal tenderness at McBurney's point.  No CVA tenderness bilaterally.  Genitourinary:    Comments: Chaperone present during pelvic exam. No external genital lesions noted. Speculum exam reveals small amount of white discharge, there is no vaginal erythema, no bleeding, no foreign bodies.  The cervix appears normal, cervical os is closed. On bimanual exam patient has significant tenderness with cervical motion and tenderness with palpation over both adnexa without palpable masses. Musculoskeletal:        General: No deformity.  Skin:    General: Skin is warm and dry.     Capillary Refill: Capillary refill takes less than 2 seconds.  Neurological:     Mental Status: She is alert.     Coordination: Coordination normal.     Comments: Speech is clear, able to follow commands Moves extremities without ataxia, coordination intact   Psychiatric:        Mood and Affect: Mood normal.        Behavior: Behavior normal.      ED Treatments / Results  Labs (all labs ordered are listed, but only abnormal results are displayed) Labs Reviewed  WET PREP, GENITAL - Abnormal; Notable for the following components:      Result Value   Clue Cells Wet Prep HPF POC PRESENT (*)    WBC, Wet Prep HPF POC FEW (*)    All other components within normal limits  COMPREHENSIVE METABOLIC PANEL - Abnormal; Notable for the following components:   Glucose, Bld 104 (*)    Total Bilirubin 0.2 (*)    All other components within normal limits  PREGNANCY, URINE  URINALYSIS, ROUTINE W REFLEX MICROSCOPIC  LIPASE, BLOOD  CBC WITH DIFFERENTIAL/PLATELET  RPR  HIV ANTIBODY (ROUTINE TESTING W REFLEX)  GC/CHLAMYDIA PROBE AMP ([place]) NOT AT [facility]     EKG None  Radiology US Transvaginal Non-ob  Result Date: 03/12/2019 CLINICAL DATA:  Pelvic pain EXAM: TRANSABDOMINAL AND TRANSVAGINAL ULTRASOUND OF PELVIS DOPPLER ULTRASOUND OF OVARIES TECHNIQUE: Study was performed transabdominally to optimize pelvic field of view evaluation and transvaginally to optimize internal visceral architecture evaluation. Color and duplex Doppler ultrasound was utilized to evaluate blood flow to the ovaries. COMPARISON:  None. FINDINGS: Uterus Measurements: 8.5 x 4.1 x 3.9 cm = volume: 70.6 mL. No fibroids or other mass visualized. Endometrium Thickness: 10 mm.  No focal abnormality visualized. Right ovary Measurements: 1.6 x 1.9 x 1.4 cm = volume: 2.1 mL. Normal appearance/no adnexal mass. Left ovary Measurements: 4.0 x 2.6 x 2.7 cm = volume: 14.5 mL. Dominant follicle right ovary measuring 2.1 x 1.9 x 1.7 cm noted. No other extrauterine pelvic or adnexal mass. Pulsed Doppler evaluation of both ovaries demonstrates normal low-resistance arterial and venous waveforms. Other findings No abnormal free fluid. IMPRESSION: Apparent dominant follicle left ovary. No other extrauterine pelvic or adnexal mass. No demonstrable ovarian torsion on either side. Uterus and endometrium appear within normal limits. Electronically Signed   By: Bretta Bang III M.D.   On: 03/12/2019 13:14   US Pelvis Complete  Result Date: 03/12/2019 CLINICAL DATA:  Pelvic pain EXAM: TRANSABDOMINAL AND TRANSVAGINAL ULTRASOUND OF PELVIS DOPPLER ULTRASOUND OF OVARIES TECHNIQUE: Study was performed transabdominally to optimize pelvic field of view evaluation and transvaginally to optimize internal visceral architecture evaluation. Color and duplex Doppler ultrasound was utilized to evaluate blood flow to the ovaries. COMPARISON:  None. FINDINGS: Uterus Measurements: 8.5 x 4.1 x 3.9 cm = volume: 70.6 mL. No fibroids or other mass visualized. Endometrium Thickness: 10 mm.  No focal abnormality visualized.  Right ovary Measurements: 1.6 x 1.9 x 1.4 cm = volume: 2.1 mL. Normal appearance/no adnexal mass. Left ovary Measurements: 4.0 x 2.6 x 2.7 cm = volume: 14.5 mL. Dominant follicle right ovary measuring 2.1 x 1.9 x 1.7 cm noted. No other extrauterine pelvic or adnexal mass. Pulsed Doppler evaluation of both ovaries demonstrates normal low-resistance arterial and venous waveforms. Other findings No abnormal free fluid. IMPRESSION: Apparent dominant follicle left ovary. No other extrauterine pelvic or adnexal mass. No demonstrable ovarian torsion on either side. Uterus and endometrium appear within normal limits. Electronically Signed   By: Bretta Bang III M.D.   On: 03/12/2019 13:14   Ct Abdomen Pelvis W Contrast  Result Date: 03/12/2019 CLINICAL DATA:  Abdominal pain for 3 days.  Dysuria. EXAM: CT ABDOMEN AND PELVIS WITH CONTRAST TECHNIQUE: Multidetector CT imaging of the abdomen and pelvis was performed using the standard protocol following bolus administration of intravenous contrast. CONTRAST:  70 mL OMNIPAQUE IOHEXOL 300 MG/ML  SOLN COMPARISON:  None. FINDINGS: Lower chest: Lung bases are clear. Hepatobiliary: No focal liver lesions are appreciable. The gallbladder is somewhat contracted. No biliary duct dilatation is evident. Pancreas: No pancreatic mass or inflammatory focus is evident. Spleen: No splenic lesions are appreciable. Adrenals/Urinary Tract: Adrenals bilaterally appear normal. Kidneys bilaterally show no evident mass or hydronephrosis on either side. There is no appreciable renal or ureteral calculus on either side. Urinary bladder is midline with wall thickness within normal limits. Stomach/Bowel: There is no appreciable bowel wall or mesenteric thickening. There is no demonstrable bowel obstruction. No free air or portal venous air. Vascular/Lymphatic: There is no abdominal aortic aneurysm. No vascular lesions are evident. Reproductive: Uterus is anteverted. There is a cyst arising from  the right ovary measuring 2.5 x 2.4 cm, a probable dominant physiologic follicle. A smaller follicle is noted in the left ovary measuring 1.7 x 1.5 cm. No other pelvic masses are appreciable. There is a surgical clip in the anterior upper pelvis, separate from the fallopian tubes. Other: There is no periappendiceal region inflammation. Appendix not seen. No abscess or ascites evident in the abdomen or pelvis. Musculoskeletal: There are no blastic or lytic bone lesions. No intramuscular or abdominal wall lesions are evident. IMPRESSION: 1. A cause for patient's symptoms has not been established with this study.  2. No demonstrable bowel obstruction. No abscess in the abdomen or pelvis. No periappendiceal region inflammation. 3. No evident renal or ureteral calculus. No hydronephrosis. Urinary bladder wall thickness is within normal limits. 4. A surgical clip is noted in the midline anterior pelvis. Question dislodged clips from previous tubal ligation. Only a single clip is appreciable in the abdomen or pelvis. 5.  Apparent follicles arising from each ovary. Electronically Signed   By: Bretta Bang III M.D.   On: 03/12/2019 14:42   [place] Art/ven Flow Abd Pelv Doppler  Result Date: 03/12/2019 CLINICAL DATA:  Pelvic pain EXAM: TRANSABDOMINAL AND TRANSVAGINAL ULTRASOUND OF PELVIS DOPPLER ULTRASOUND OF OVARIES TECHNIQUE: Study was performed transabdominally to optimize pelvic field of view evaluation and transvaginally to optimize internal visceral architecture evaluation. Color and duplex Doppler ultrasound was utilized to evaluate blood flow to the ovaries. COMPARISON:  None. FINDINGS: Uterus Measurements: 8.5 x 4.1 x 3.9 cm = volume: 70.6 mL. No fibroids or other mass visualized. Endometrium Thickness: 10 mm.  No focal abnormality visualized. Right ovary Measurements: 1.6 x 1.9 x 1.4 cm = volume: 2.1 mL. Normal appearance/no adnexal mass. Left ovary Measurements: 4.0 x 2.6 x 2.7 cm = volume: 14.5 mL. Dominant  follicle right ovary measuring 2.1 x 1.9 x 1.7 cm noted. No other extrauterine pelvic or adnexal mass. Pulsed Doppler evaluation of both ovaries demonstrates normal low-resistance arterial and venous waveforms. Other findings No abnormal free fluid. IMPRESSION: Apparent dominant follicle left ovary. No other extrauterine pelvic or adnexal mass. No demonstrable ovarian torsion on either side. Uterus and endometrium appear within normal limits. Electronically Signed   By: Bretta Bang III M.D.   On: 03/12/2019 13:14    Procedures Procedures (including critical care time)  Medications Ordered in ED Medications  ketorolac (TORADOL) 30 MG/ML injection 30 mg (30 mg Intravenous Given 03/12/19 1158)  ondansetron (ZOFRAN) injection 4 mg (4 mg Intravenous Given 03/12/19 1158)     Initial Impression / Assessment and Plan / ED Course  I have reviewed the triage vital signs and the nursing notes.  Pertinent labs & imaging results that were available during my care of the patient were reviewed by me and considered in my medical decision making (see chart for details).  She presents to the emergency department for evaluation of lower abdominal pain.  On arrival she is mildly tachycardic, afebrile, all other vitals normal.  She appears mildly uncomfortable.  Her pain is associated with mild nausea, she is having normal bowel movements, she has had some urinary symptoms, denies vaginal discharge or bleeding.  On exam she has tenderness across the lower abdomen most notable in the left lower quadrant and suprapubic region with some slight guarding.  No upper abdominal tenderness on exam.  Differential includes PID, ovarian cyst/torsion, urinary tract infection, diverticulitis.  Will check basic labs, pelvic exam does reveal some uterine and adnexal tenderness with small amount of discharge noted, wet prep and STD testing collected.  Will start with a pelvic ultrasound first to see if this identifies any pathology  that could be causing patient's pain prior to performing a CT which would entail radiation.  Patient's labs are very reassuring, no leukocytosis, normal hemoglobin, no acute electrolyte derangements requiring intervention, normal renal and liver function, normal lipase.  Urinalysis does not show signs of infection, negative pregnancy.  Wet prep shows signs of BV with few WBCs and clue cells present.  STD testing is pending.  Pelvic ultrasound shows a prominent follicle within the left  ovary but is otherwise unremarkable, no uterine fibroids or masses noted, no ovarian cysts or evidence of torsion.  Given that patient is still experiencing discomfort will proceed with CT scan, although patient later mentions that she has had this pain intermittently for about a year now so I feel that the likelihood of acute pathology is much lower.  But given pain worsening over the past 3 days will check CT to rule out diverticulitis or other acute pathology.  CT abdomen pelvis is unremarkable aside from a surgical clip noted in the midline anterior pelvis which may be dislodged from her prior tubal ligation as they are not noted in this location.  Discussed this result with the patient recommend that she follow-up with her OB/GYN and use protection while sexually active until she is able to follow-up regarding this.  We will treat patient with Flagyl for BV and have her follow-up with PCP and OB/GYN.  Return precautions discussed.  Patient expresses understanding and agreement with this plan.  Discharged home in good condition.  Final Clinical Impressions(s) / ED Diagnoses   Final diagnoses:  Lower abdominal pain  BV (bacterial vaginosis)    ED Discharge Orders    None       Legrand Rams 03/12/19 1638    Melene Plan, DO 03/14/19 1009

## 2019-03-12 NOTE — Discharge Instructions (Signed)
Your work-up today is reassuring does not show any cause for the pain you have been having aside from your tubal ligation clip being dislodged, please make sure you are using protection if you are sexually active until you are able to follow-up with your OB/GYN.  Your labs also showed BV, please take Flagyl for the next week as directed and do not drink alcohol while taking this medication.  You have STD testing pending you will be called if anything is positive.  Your ultrasound and CT otherwise looks good.

## 2019-03-12 NOTE — ED Notes (Signed)
Blood draw and IV attempt x2 without success.  RN aware.

## 2019-03-12 NOTE — ED Triage Notes (Signed)
Lower abdominal pain x 3 days.  Some dysuria. No fever.  No vaginal discharge.  No N/V/D.  Some back pain.

## 2019-03-13 LAB — HIV ANTIBODY (ROUTINE TESTING W REFLEX): HIV Screen 4th Generation wRfx: NONREACTIVE

## 2019-03-13 LAB — GC/CHLAMYDIA PROBE AMP (~~LOC~~) NOT AT ARMC
Chlamydia: NEGATIVE
Neisseria Gonorrhea: NEGATIVE

## 2019-03-13 LAB — RPR: RPR Ser Ql: NONREACTIVE

## 2019-03-20 ENCOUNTER — Emergency Department (HOSPITAL_COMMUNITY)
Admission: EM | Admit: 2019-03-20 | Discharge: 2019-03-20 | Disposition: A | Discharge status: Home / Self Care | Payer: BLUE CROSS/BLUE SHIELD | Attending: Emergency Medicine | Admitting: Emergency Medicine

## 2019-03-20 ENCOUNTER — Other Ambulatory Visit: Payer: Self-pay

## 2019-03-20 ENCOUNTER — Encounter (HOSPITAL_COMMUNITY): Payer: Self-pay | Admitting: Family Medicine

## 2019-03-20 DIAGNOSIS — M5442 Lumbago with sciatica, left side: Secondary | ICD-10-CM | POA: Diagnosis not present

## 2019-03-20 DIAGNOSIS — F17210 Nicotine dependence, cigarettes, uncomplicated: Secondary | ICD-10-CM | POA: Diagnosis not present

## 2019-03-20 DIAGNOSIS — R109 Unspecified abdominal pain: Secondary | ICD-10-CM | POA: Diagnosis present

## 2019-03-20 MED ORDER — TRAMADOL HCL 50 MG PO TABS: 50.0000 mg | ORAL_TABLET | Freq: Four times a day (QID) | ORAL | 0 refills | Status: DC | PRN

## 2019-03-20 MED ORDER — METHOCARBAMOL 500 MG PO TABS: 500.0000 mg | ORAL_TABLET | Freq: Every evening | ORAL | 0 refills | Status: DC | PRN

## 2019-03-20 MED ORDER — HYDROCODONE-ACETAMINOPHEN 5-325 MG PO TABS
1.0000 | ORAL_TABLET | Freq: Once | ORAL | Status: AC
  Administered 2019-03-20: 1 via ORAL
  Filled 2019-03-20: qty 1

## 2019-03-20 MED ORDER — DEXAMETHASONE 4 MG PO TABS
10.0000 mg | ORAL_TABLET | Freq: Once | ORAL | Status: AC
  Administered 2019-03-20: 15:00:00 10 mg via ORAL
  Filled 2019-03-20: qty 2

## 2019-03-20 NOTE — ED Provider Notes (Signed)
[facility]-EMERGENCY DEPT Provider Note   CSN: [id number] Arrival date & time: 03/20/19  1356    History   Chief Complaint Chief Complaint  Patient presents with  . Abdominal Pain    HPI Destiny Cook is a 33 y.o. female who presents with back pain and pelvic pain.  No significant past medical history.  Past surgical history significant for tubal ligation 11 years ago and C-sections.  She states that she has been having low back pain which radiates to the left pelvic area and left thigh for the past week.  It is constant and radiates down her leg at times.  Is associated with tingling and cramping sensation in the L thigh. It is worse when she moves and walks.  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  She was seen in the ED 1 week ago and had an extensive work-up with labs, urine, STD testing, CT abdomen pelvis, and pelvic ultrasound.  Work-up was overall reassuring but did show a possible displaced surgical clip from her tubal ligation.  She was advised to follow-up with OB/GYN and she has made an appointment for this upcoming Monday but the pain was severe and she felt like she could not wait. No fever, syncope, trauma, unexplained weight loss, hx of cancer, loss of bowel/bladder function, saddle anesthesia, urinary retention, IVDU. She reports some nausea but no vomiting. No diarrhea/constipation, urinary symptoms, vaginal discharge or bleeding.   HPI  History reviewed. No pertinent past medical history.  There are no active problems to display for this patient.   Past Surgical History:  Procedure Laterality Date  . CESAREAN SECTION     x 4  . TUBAL LIGATION       OB History    Gravida  5   Para  4   Term  2   Preterm  2   AB  1   Living  4     SAB  1   TAB  0   Ectopic  0   Multiple  0   Live Births  4        Obstetric Comments  c-sect x 4         Home Medications    Prior to Admission  medications   Medication Sig Start Date End Date Taking? Authorizing Provider  methocarbamol (ROBAXIN) 500 MG tablet Take 1 tablet (500 mg total) by mouth at bedtime and may repeat dose one time if needed. 03/20/19   Bethel Born, PA-C  metroNIDAZOLE (FLAGYL) 500 MG tablet Take 1 tablet (500 mg total) by mouth 2 (two) times daily. One po bid x 7 days 03/12/19   Dartha Lodge, PA-C  traMADol (ULTRAM) 50 MG tablet Take 1 tablet (50 mg total) by mouth every 6 (six) hours as needed. 03/20/19   Bethel Born, PA-C    Family History History reviewed. No pertinent family history.  Social History Social History   Tobacco Use  . Smoking status: Current Every Day Smoker    Packs/day: 0.50    Years: 15.00    Pack years: 7.50    Types: Cigarettes  . Smokeless tobacco: Never Used  . Tobacco comment: started age 13  Substance Use Topics  . Alcohol use: No  . Drug use: No     Allergies   Penicillins   Review of Systems Review of Systems  Constitutional: Negative for fever.  Gastrointestinal: Positive for nausea.  Negative for abdominal pain, constipation, diarrhea and vomiting.  Genitourinary: Negative for difficulty urinating, dysuria, flank pain, menstrual problem, vaginal bleeding and vaginal discharge.  Musculoskeletal: Positive for back pain and myalgias.     Physical Exam Updated Vital Signs BP (!) 148/91 (BP Location: Right Arm)   Pulse 98   Temp 98.6 F (37 C) (Oral)   Resp 18   Ht 5\' 3"  (1.6 m)   Wt 113.4 kg   SpO2 100%   BMI 44.29 kg/m   Physical Exam Vitals signs and nursing note reviewed.  Constitutional:      General: She is not in acute distress.    Appearance: Normal appearance. She is well-developed. She is not ill-appearing.  HENT:     Head: Normocephalic and atraumatic.  Eyes:     General: No scleral icterus.       Right eye: No discharge.        Left eye: No discharge.     Conjunctiva/sclera: Conjunctivae normal.     Pupils: Pupils are  equal, round, and reactive to light.  Neck:     Musculoskeletal: Normal range of motion.  Cardiovascular:     Rate and Rhythm: Normal rate and regular rhythm.  Pulmonary:     Effort: Pulmonary effort is normal. No respiratory distress.     Breath sounds: Normal breath sounds.  Abdominal:     General: There is no distension.     Palpations: Abdomen is soft.     Tenderness: There is no abdominal tenderness.  Musculoskeletal:     Comments: Back: Inspection: No masses, deformity, or rash Palpation: Lower lumbar tenderness with left sided back tenderness.  Strength: 5/5 in lower extremities and normal plantar and dorsiflexion Sensation: Intact sensation with light touch in lower extremities bilaterally Intact distal pulses Gait: Normal gait   Skin:    General: Skin is warm and dry.  Neurological:     Mental Status: She is alert and oriented to person, place, and time.  Psychiatric:        Behavior: Behavior normal.      ED Treatments / Results  Labs (all labs ordered are listed, but only abnormal results are displayed) Labs Reviewed - No data to display  EKG None  Radiology No results found.  Procedures Procedures (including critical care time)  Medications Ordered in ED Medications  HYDROcodone-acetaminophen (NORCO/VICODIN) 5-325 MG per tablet 1 tablet (has no administration in time range)  dexamethasone (DECADRON) tablet 10 mg (has no administration in time range)     Initial Impression / Assessment and Plan / ED Course  I have reviewed the triage vital signs and the nursing notes.  Pertinent labs & imaging results that were available during my care of the patient were reviewed by me and considered in my medical decision making (see chart for details).  33 year old female presents with back pain/L sided pelvic pain and L thigh pain for the past week. She is mildly hypertensive but otherwise vitals are normal. Clinically symptoms sounds more MSK. She had a recent  extensive work up for abdominal/pelvis pain which showed a possible displaced surgical clip but I feel this is more incidental. She is tender over the low back and the thigh. She does not have any leg weakness and is ambulatory. She has f/u with OBGYN on Monday. I do not feel she needs further testing today. Will treat for radicular pain with pain medicine, muscle relaxers, dose of Decadron here. She was encouraged to f/u with  her OBGYN.  Final Clinical Impressions(s) / ED Diagnoses   Final diagnoses:  Acute left-sided low back pain with left-sided sciatica    ED Discharge Orders         Ordered    traMADol (ULTRAM) 50 MG tablet  Every 6 hours PRN     03/20/19 1437    methocarbamol (ROBAXIN) 500 MG tablet  at bedtime and repeat x1 PRN     03/20/19 1437           Bethel BornGekas, Leannah Guse Marie, PA-C 03/20/19 1457    Virgina Norfolkuratolo, Adam, DO 03/20/19 1457

## 2019-03-20 NOTE — ED Triage Notes (Signed)
Patient states is experiencing lower abd pain that started 2 days ago. Denies nausea, vomiting, diarrhea, and fever. Patient states she took Ibuprofen earlier today for her symptoms. Denies urinary symptoms or vaginal discharge/bleeding.

## 2019-03-20 NOTE — Discharge Instructions (Signed)
Continue Ibuprofen or Aleve for mild-moderate pain as needed for the next week. Take this medicine with food. Take Tramadol as needed for severe pain Take muscle relaxer at bedtime to help you sleep. This medicine makes you drowsy so do not take before driving or work Use a heating pad for sore muscles - use for 20 minutes several times a day Return for worsening symptoms

## 2019-03-26 ENCOUNTER — Emergency Department (HOSPITAL_COMMUNITY): Payer: BLUE CROSS/BLUE SHIELD

## 2019-03-26 ENCOUNTER — Other Ambulatory Visit: Payer: Self-pay

## 2019-03-26 ENCOUNTER — Emergency Department (HOSPITAL_COMMUNITY)
Admission: EM | Admit: 2019-03-26 | Discharge: 2019-03-26 | Disposition: A | Discharge status: Home / Self Care | Payer: BLUE CROSS/BLUE SHIELD | Attending: Emergency Medicine | Admitting: Emergency Medicine

## 2019-03-26 ENCOUNTER — Encounter (HOSPITAL_COMMUNITY): Payer: Self-pay | Admitting: Emergency Medicine

## 2019-03-26 ENCOUNTER — Encounter: Payer: Self-pay | Admitting: Family Medicine

## 2019-03-26 ENCOUNTER — Ambulatory Visit (INDEPENDENT_AMBULATORY_CARE_PROVIDER_SITE_OTHER): Payer: BLUE CROSS/BLUE SHIELD | Admitting: Family Medicine

## 2019-03-26 VITALS — BP 142/90 | HR 84 | Temp 97.9°F | Ht 63.0 in | Wt 262.0 lb

## 2019-03-26 DIAGNOSIS — Z6841 Body Mass Index (BMI) 40.0 and over, adult: Secondary | ICD-10-CM

## 2019-03-26 DIAGNOSIS — R0602 Shortness of breath: Secondary | ICD-10-CM

## 2019-03-26 DIAGNOSIS — I10 Essential (primary) hypertension: Secondary | ICD-10-CM | POA: Diagnosis present

## 2019-03-26 DIAGNOSIS — Z23 Encounter for immunization: Secondary | ICD-10-CM

## 2019-03-26 DIAGNOSIS — Z7689 Persons encountering health services in other specified circumstances: Secondary | ICD-10-CM | POA: Diagnosis not present

## 2019-03-26 DIAGNOSIS — M549 Dorsalgia, unspecified: Secondary | ICD-10-CM

## 2019-03-26 DIAGNOSIS — Z09 Encounter for follow-up examination after completed treatment for conditions other than malignant neoplasm: Secondary | ICD-10-CM | POA: Diagnosis not present

## 2019-03-26 DIAGNOSIS — F17210 Nicotine dependence, cigarettes, uncomplicated: Secondary | ICD-10-CM | POA: Diagnosis not present

## 2019-03-26 DIAGNOSIS — F419 Anxiety disorder, unspecified: Secondary | ICD-10-CM

## 2019-03-26 DIAGNOSIS — E66813 Obesity, class 3: Secondary | ICD-10-CM

## 2019-03-26 DIAGNOSIS — Z131 Encounter for screening for diabetes mellitus: Secondary | ICD-10-CM

## 2019-03-26 DIAGNOSIS — K029 Dental caries, unspecified: Secondary | ICD-10-CM

## 2019-03-26 DIAGNOSIS — G8929 Other chronic pain: Secondary | ICD-10-CM

## 2019-03-26 DIAGNOSIS — R7303 Prediabetes: Secondary | ICD-10-CM

## 2019-03-26 HISTORY — DX: Essential (primary) hypertension: I10

## 2019-03-26 LAB — BASIC METABOLIC PANEL
Anion gap: 8 (ref 5–15)
BUN: 14 mg/dL (ref 6–20)
CO2: 22 mmol/L (ref 22–32)
Calcium: 8.6 mg/dL — ABNORMAL LOW (ref 8.9–10.3)
Chloride: 108 mmol/L (ref 98–111)
Creatinine, Ser: 0.94 mg/dL (ref 0.44–1.00)
GFR calc Af Amer: >60 mL/min (ref >60)
GFR calc non Af Amer: >60 mL/min (ref >60)
Glucose, Bld: 108 mg/dL — ABNORMAL HIGH (ref 70–99)
Potassium: 3.6 mmol/L (ref 3.5–5.1)
Sodium: 138 mmol/L (ref 135–145)

## 2019-03-26 LAB — CBC WITH DIFFERENTIAL/PLATELET
Abs Immature Granulocytes: 0.02 10*3/uL (ref 0.00–0.07)
Basophils Absolute: 0 10*3/uL (ref 0.0–0.1)
Basophils Relative: 1 %
Eosinophils Absolute: 0.3 10*3/uL (ref 0.0–0.5)
Eosinophils Relative: 3 %
HCT: 37.5 % (ref 36.0–46.0)
Hemoglobin: 12.3 g/dL (ref 12.0–15.0)
Immature Granulocytes: 0 %
Lymphocytes Relative: 32 %
Lymphs Abs: 2.7 10*3/uL (ref 0.7–4.0)
MCH: 27.6 pg (ref 26.0–34.0)
MCHC: 32.8 g/dL (ref 30.0–36.0)
MCV: 84.1 fL (ref 80.0–100.0)
Monocytes Absolute: 0.5 10*3/uL (ref 0.1–1.0)
Monocytes Relative: 6 %
Neutro Abs: 4.9 10*3/uL (ref 1.7–7.7)
Neutrophils Relative %: 58 %
Platelets: 274 10*3/uL (ref 150–400)
RBC: 4.46 MIL/uL (ref 3.87–5.11)
RDW: 13.5 % (ref 11.5–15.5)
WBC: 8.5 10*3/uL (ref 4.0–10.5)
nRBC: 0 % (ref 0.0–0.2)

## 2019-03-26 LAB — I-STAT BETA HCG BLOOD, ED (MC, WL, AP ONLY): I-stat hCG, quantitative: <5 m[IU]/mL (ref <5)

## 2019-03-26 LAB — POCT URINALYSIS DIP (MANUAL ENTRY)
Bilirubin, UA: NEGATIVE
Glucose, UA: NEGATIVE mg/dL
Ketones, POC UA: NEGATIVE mg/dL
Nitrite, UA: NEGATIVE
Protein Ur, POC: 30 mg/dL — AB
Spec Grav, UA: 1.025 (ref 1.010–1.025)
Urobilinogen, UA: 1 E.U./dL
pH, UA: 6 (ref 5.0–8.0)

## 2019-03-26 LAB — POCT GLYCOSYLATED HEMOGLOBIN (HGB A1C): Hemoglobin A1C: 5.9 % — AB (ref 4.0–5.6)

## 2019-03-26 LAB — D-DIMER, QUANTITATIVE: D-Dimer, Quant: 0.57 ug/mL-FEU — ABNORMAL HIGH (ref 0.00–0.50)

## 2019-03-26 LAB — TROPONIN I: Troponin I: <0.03 ng/mL (ref <0.03)

## 2019-03-26 MED ORDER — METFORMIN HCL 500 MG PO TABS: 500.0000 mg | ORAL_TABLET | Freq: Two times a day (BID) | ORAL | 1 refills | Status: DC

## 2019-03-26 MED ORDER — HYDROCHLOROTHIAZIDE 25 MG PO TABS: 25.0000 mg | ORAL_TABLET | Freq: Every day | ORAL | 1 refills | Status: DC

## 2019-03-26 MED ORDER — IBUPROFEN 800 MG PO TABS: 800.0000 mg | ORAL_TABLET | Freq: Three times a day (TID) | ORAL | 1 refills | Status: DC | PRN

## 2019-03-26 MED ORDER — IOHEXOL 350 MG/ML SOLN
100.0000 mL | Freq: Once | INTRAVENOUS | Status: AC | PRN
  Administered 2019-03-26: 100 mL via INTRAVENOUS

## 2019-03-26 MED ORDER — SODIUM CHLORIDE (PF) 0.9 % IJ SOLN
INTRAMUSCULAR | Status: AC
  Filled 2019-03-26: qty 50

## 2019-03-26 MED ORDER — CYCLOBENZAPRINE HCL 10 MG PO TABS: 10.0000 mg | ORAL_TABLET | Freq: Three times a day (TID) | ORAL | 1 refills | Status: DC | PRN

## 2019-03-26 NOTE — Patient Instructions (Addendum)
Metformin tablets What is this medicine? METFORMIN (met FOR min) is used to treat type 2 diabetes. It helps to control blood sugar. Treatment is combined with diet and exercise. This medicine can be used alone or with other medicines for diabetes. This medicine may be used for other purposes; ask your health care provider or pharmacist if you have questions. COMMON BRAND NAME(S): Glucophage What should I tell my health care provider before I take this medicine? They need to know if you have any of these conditions: -anemia -dehydration -heart disease -frequently drink alcohol-containing beverages -kidney disease -liver disease -polycystic ovary syndrome -serious infection or injury -vomiting -an unusual or allergic reaction to metformin, other medicines, foods, dyes, or preservatives -pregnant or trying to get pregnant -breast-feeding How should I use this medicine? Take this medicine by mouth with a glass of water. Follow the directions on the prescription label. Take this medicine with food. Take your medicine at regular intervals. Do not take your medicine more often than directed. Do not stop taking except on your doctor's advice. Talk to your pediatrician regarding the use of this medicine in children. While this drug may be prescribed for children as young as 19 years of age for selected conditions, precautions do apply. Overdosage: If you think you have taken too much of this medicine contact a poison control center or emergency room at once. NOTE: This medicine is only for you. Do not share this medicine with others. What if I miss a dose? If you miss a dose, take it as soon as you can. If it is almost time for your next dose, take only that dose. Do not take double or extra doses. What may interact with this medicine? Do not take this medicine with any of the following medications: -certain contrast medicines given before X-rays, CT scans, MRI, or other  procedures -dofetilide This medicine may also interact with the following medications: -acetazolamide -alcohol -certain antivirals for HIV or hepatitis -certain medicines for blood pressure, heart disease, irregular heart beat -cimetidine -dichlorphenamide -digoxin -diuretics -female hormones, like estrogens or progestins and birth control pills -glycopyrrolate -isoniazid -lamotrigine -memantine -methazolamide -metoclopramide -midodrine -niacin -phenothiazines like chlorpromazine, mesoridazine, prochlorperazine, thioridazine -phenytoin -ranolazine -steroid medicines like prednisone or cortisone -stimulant medicines for attention disorders, weight loss, or to stay awake -thyroid medicines -topiramate -trospium -vandetanib -zonisamide This list may not describe all possible interactions. Give your health care provider a list of all the medicines, herbs, non-prescription drugs, or dietary supplements you use. Also tell them if you smoke, drink alcohol, or use illegal drugs. Some items may interact with your medicine. What should I watch for while using this medicine? Visit your doctor or health care professional for regular checks on your progress. A test called the HbA1C (A1C) will be monitored. This is a simple blood test. It measures your blood sugar control over the last 2 to 3 months. You will receive this test every 3 to 6 months. Learn how to check your blood sugar. Learn the symptoms of low and high blood sugar and how to manage them. Always carry a quick-source of sugar with you in case you have symptoms of low blood sugar. Examples include hard sugar candy or glucose tablets. Make sure others know that you can choke if you eat or drink when you develop serious symptoms of low blood sugar, such as seizures or unconsciousness. They must get medical help at once. Tell your doctor or health care professional if you have high blood sugar. You  might need to change the dose of  your medicine. If you are sick or exercising more than usual, you might need to change the dose of your medicine. Do not skip meals. Ask your doctor or health care professional if you should avoid alcohol. Many nonprescription cough and cold products contain sugar or alcohol. These can affect blood sugar. This medicine may cause ovulation in premenopausal women who do not have regular monthly periods. This may increase your chances of becoming pregnant. You should not take this medicine if you become pregnant or think you may be pregnant. Talk with your doctor or health care professional about your birth control options while taking this medicine. Contact your doctor or health care professional right away if you think you are pregnant. If you are going to need surgery, a MRI, CT scan, or other procedure, tell your doctor that you are taking this medicine. You may need to stop taking this medicine before the procedure. Wear a medical ID bracelet or chain, and carry a card that describes your disease and details of your medicine and dosage times. This medicine may cause a decrease in folic acid and vitamin B12. You should make sure that you get enough vitamins while you are taking this medicine. Discuss the foods you eat and the vitamins you take with your health care professional. What side effects may I notice from receiving this medicine? Side effects that you should report to your doctor or health care professional as soon as possible: -allergic reactions like skin rash, itching or hives, swelling of the face, lips, or tongue -breathing problems -feeling faint or lightheaded, falls -muscle aches or pains -signs and symptoms of low blood sugar such as feeling anxious, confusion, dizziness, increased hunger, unusually weak or tired, sweating, shakiness, cold, irritable, headache, blurred vision, fast heartbeat, loss of consciousness -slow or irregular heartbeat -unusual stomach pain or  discomfort -unusually tired or weak Side effects that usually do not require medical attention (report to your doctor or health care professional if they continue or are bothersome): -diarrhea -headache -heartburn -metallic taste in mouth -nausea -stomach gas, upset This list may not describe all possible side effects. Call your doctor for medical advice about side effects. You may report side effects to FDA at 1-800-FDA-1088. Where should I keep my medicine? Keep out of the reach of children. Store at room temperature between 15 and 30 degrees C (59 and 86 degrees F). Protect from moisture and light. Throw away any unused medicine after the expiration date. NOTE: This sheet is a summary. It may not cover all possible information. If you have questions about this medicine, talk to your doctor, pharmacist, or health care provider.  2019 Elsevier/Gold Standard (2017-12-14 19:15:19) Exercising to Lose Weight Exercise is structured, repetitive physical activity to improve fitness and health. Getting regular exercise is important for everyone. It is especially important if you are overweight. Being overweight increases your risk of heart disease, stroke, diabetes, high blood pressure, and several types of cancer. Reducing your calorie intake and exercising can help you lose weight. Exercise is usually categorized as moderate or vigorous intensity. To lose weight, most people need to do a certain amount of moderate-intensity or vigorous-intensity exercise each week. Moderate-intensity exercise  Moderate-intensity exercise is any activity that gets you moving enough to burn at least three times more energy (calories) than if you were sitting. Examples of moderate exercise include:  Walking a mile in 15 minutes.  Doing light yard work.  Biking at an  easy pace. Most people should get at least 150 minutes (2 hours and 30 minutes) a week of moderate-intensity exercise to maintain their body  weight. Vigorous-intensity exercise Vigorous-intensity exercise is any activity that gets you moving enough to burn at least six times more calories than if you were sitting. When you exercise at this intensity, you should be working hard enough that you are not able to carry on a conversation. Examples of vigorous exercise include:  Running.  Playing a team sport, such as football, basketball, and soccer.  Jumping rope. Most people should get at least 75 minutes (1 hour and 15 minutes) a week of vigorous-intensity exercise to maintain their body weight. How can exercise affect me? When you exercise enough to burn more calories than you eat, you lose weight. Exercise also reduces body fat and builds muscle. The more muscle you have, the more calories you burn. Exercise also:  Improves mood.  Reduces stress and tension.  Improves your overall fitness, flexibility, and endurance.  Increases bone strength. The amount of exercise you need to lose weight depends on:  Your age.  The type of exercise.  Any health conditions you have.  Your overall physical ability. Talk to your health care provider about how much exercise you need and what types of activities are safe for you. What actions can I take to lose weight? Nutrition   Make changes to your diet as told by your health care provider or diet and nutrition specialist (dietitian). This may include: ? Eating fewer calories. ? Eating more protein. ? Eating less unhealthy fats. ? Eating a diet that includes fresh fruits and vegetables, whole grains, low-fat dairy products, and lean protein. ? Avoiding foods with added fat, salt, and sugar.  Drink plenty of water while you exercise to prevent dehydration or heat stroke. Activity  Choose an activity that you enjoy and set realistic goals. Your health care provider can help you make an exercise plan that works for you.  Exercise at a moderate or vigorous intensity most days of  the week. ? The intensity of exercise may vary from person to person. You can tell how intense a workout is for you by paying attention to your breathing and heartbeat. Most people will notice their breathing and heartbeat get faster with more intense exercise.  Do resistance training twice each week, such as: ? Push-ups. ? Sit-ups. ? Lifting weights. ? Using resistance bands.  Getting short amounts of exercise can be just as helpful as long structured periods of exercise. If you have trouble finding time to exercise, try to include exercise in your daily routine. ? Get up, stretch, and walk around every 30 minutes throughout the day. ? Go for a walk during your lunch break. ? Park your car farther away from your destination. ? If you take public transportation, get off one stop early and walk the rest of the way. ? Make phone calls while standing up and walking around. ? Take the stairs instead of elevators or escalators.  Wear comfortable clothes and shoes with good support.  Do not exercise so much that you hurt yourself, feel dizzy, or get very short of breath. Where to find more information  [place] Department of Health and Human Services: [employer]at  Centers for Disease Control and Prevention (CDC): http://www.wolf.info/ Contact a health care provider:  Before starting a new exercise program.  If you have questions or concerns about your weight.  If you have a medical problem that keeps you  from exercising. Get help right away if you have any of the following while exercising:  Injury.  Dizziness.  Difficulty breathing or shortness of breath that does not go away when you stop exercising.  Chest pain.  Rapid heartbeat. Summary  Being overweight increases your risk of heart disease, stroke, diabetes, high blood pressure, and several types of cancer.  Losing weight happens when you burn more calories than you eat.  Reducing the amount of calories you eat in addition to  getting regular moderate or vigorous exercise each week helps you lose weight. This information is not intended to replace advice given to you by your health care provider. Make sure you discuss any questions you have with your health care provider. Document Released: 12/10/2010 Document Revised: 11/20/2017 Document Reviewed: 11/20/2017 Elsevier Interactive Patient Education  2019 [employer] [employer] Calories are units of energy. Your body needs a certain amount of calories from food to keep you going throughout the day. When you eat more calories than your body needs, your body stores the extra calories as fat. When you eat fewer calories than your body needs, your body burns fat to get the energy it needs. Calorie counting means keeping track of how many calories you eat and drink each day. Calorie counting can be helpful if you need to lose weight. If you make sure to eat fewer calories than your body needs, you should lose weight. Ask your health care provider what a healthy weight is for you. For calorie counting to work, you will need to eat the right number of calories in a day in order to lose a healthy amount of weight per week. A dietitian can help you determine how many calories you need in a day and will give you suggestions on how to reach your calorie goal.  A healthy amount of weight to lose per week is usually 1-2 lb (0.5-0.9 kg). This usually means that your daily calorie intake should be reduced by 500-750 calories.  Eating 1,200 - 1,500 calories per day can help most women lose weight.  Eating 1,500 - 1,800 calories per day can help most men lose weight. What is my plan? My goal is to have __________ calories per day. If I have this many calories per day, I should lose around __________ pounds per week. What do I need to know about calorie counting? In order to meet your daily calorie goal, you will need to:  Find out how many calories are in each  food you would like to eat. Try to do this before you eat.  Decide how much of the food you plan to eat.  Write down what you ate and how many calories it had. Doing this is called keeping a food log. To successfully lose weight, it is important to balance calorie counting with a healthy lifestyle that includes regular activity. Aim for 150 minutes of moderate exercise (such as walking) or 75 minutes of vigorous exercise (such as running) each week. Where do I find calorie information?  The number of calories in a food can be found on a Nutrition Facts label. If a food does not have a Nutrition Facts label, try to look up the calories online or ask your dietitian for help. Remember that calories are listed per serving. If you choose to have more than one serving of a food, you will have to multiply the calories per serving by the amount of servings you plan to eat. For  example, the label on a package of bread might say that a serving size is 1 slice and that there are 90 calories in a serving. If you eat 1 slice, you will have eaten 90 calories. If you eat 2 slices, you will have eaten 180 calories. How do I keep a food log? Immediately after each meal, record the following information in your food log:  What you ate. Don't forget to include toppings, sauces, and other extras on the food.  How much you ate. This can be measured in cups, ounces, or number of items.  How many calories each food and drink had.  The total number of calories in the meal. Keep your food log near you, such as in a small notebook in your pocket, or use a mobile app or website. Some programs will calculate calories for you and show you how many calories you have left for the day to meet your goal. What are some calorie counting tips?   Use your calories on foods and drinks that will fill you up and not leave you hungry: ? Some examples of foods that fill you up are nuts and nut butters, vegetables, lean proteins, and  high-fiber foods like whole grains. High-fiber foods are foods with more than 5 g fiber per serving. ? Drinks such as sodas, specialty coffee drinks, alcohol, and juices have a lot of calories, yet do not fill you up.  Eat nutritious foods and avoid empty calories. Empty calories are calories you get from foods or beverages that do not have many vitamins or protein, such as candy, sweets, and soda. It is better to have a nutritious high-calorie food (such as an avocado) than a food with few nutrients (such as a bag of chips).  Know how many calories are in the foods you eat most often. This will help you calculate calorie counts faster.  Pay attention to calories in drinks. Low-calorie drinks include water and unsweetened drinks.  Pay attention to nutrition labels for "low fat" or "fat free" foods. These foods sometimes have the same amount of calories or more calories than the full fat versions. They also often have added sugar, starch, or salt, to make up for flavor that was removed with the fat.  Find a way of tracking calories that works for you. Get creative. Try different apps or programs if writing down calories does not work for you. What are some portion control tips?  Know how many calories are in a serving. This will help you know how many servings of a certain food you can have.  Use a measuring cup to measure serving sizes. You could also try weighing out portions on a kitchen scale. With time, you will be able to estimate serving sizes for some foods.  Take some time to put servings of different foods on your favorite plates, bowls, and cups so you know what a serving looks like.  Try not to eat straight from a bag or box. Doing this can lead to overeating. Put the amount you would like to eat in a cup or on a plate to make sure you are eating the right portion.  Use smaller plates, glasses, and bowls to prevent overeating.  Try not to multitask (for example, watch TV or use  your computer) while eating. If it is time to eat, sit down at a table and enjoy your food. This will help you to know when you are full. It will also help you to  be aware of what you are eating and how much you are eating. What are tips for following this plan? Reading food labels  Check the calorie count compared to the serving size. The serving size may be smaller than what you are used to eating.  Check the source of the calories. Make sure the food you are eating is high in vitamins and protein and low in saturated and trans fats. Shopping  Read nutrition labels while you shop. This will help you make healthy decisions before you decide to purchase your food.  Make a grocery list and stick to it. Cooking  Try to cook your favorite foods in a healthier way. For example, try baking instead of frying.  Use low-fat dairy products. Meal planning  Use more fruits and vegetables. Half of your plate should be fruits and vegetables.  Include lean proteins like poultry and fish. How do I count calories when eating out?  Ask for smaller portion sizes.  Consider sharing an entree and sides instead of getting your own entree.  If you get your own entree, eat only half. Ask for a box at the beginning of your meal and put the rest of your entree in it so you are not tempted to eat it.  If calories are listed on the menu, choose the lower calorie options.  Choose dishes that include vegetables, fruits, whole grains, low-fat dairy products, and lean protein.  Choose items that are boiled, broiled, grilled, or steamed. Stay away from items that are buttered, battered, fried, or served with cream sauce. Items labeled "crispy" are usually fried, unless stated otherwise.  Choose water, low-fat milk, unsweetened iced tea, or other drinks without added sugar. If you want an alcoholic beverage, choose a lower calorie option such as a glass of wine or light beer.  Ask for dressings, sauces, and  syrups on the side. These are usually high in calories, so you should limit the amount you eat.  If you want a salad, choose a garden salad and ask for grilled meats. Avoid extra toppings like bacon, cheese, or fried items. Ask for the dressing on the side, or ask for olive oil and vinegar or lemon to use as dressing.  Estimate how many servings of a food you are given. For example, a serving of cooked rice is  cup or about the size of half a baseball. Knowing serving sizes will help you be aware of how much food you are eating at restaurants. The list below tells you how big or small some common portion sizes are based on everyday objects: ? 1 oz--4 stacked dice. ? 3 oz--1 deck of cards. ? 1 tsp--1 die. ? 1 Tbsp-- a ping-pong ball. ? 2 Tbsp--1 ping-pong ball. ?  cup-- baseball. ? 1 cup--1 baseball. Summary  Calorie counting means keeping track of how many calories you eat and drink each day. If you eat fewer calories than your body needs, you should lose weight.  A healthy amount of weight to lose per week is usually 1-2 lb (0.5-0.9 kg). This usually means reducing your daily calorie intake by 500-750 calories.  The number of calories in a food can be found on a Nutrition Facts label. If a food does not have a Nutrition Facts label, try to look up the calories online or ask your dietitian for help.  Use your calories on foods and drinks that will fill you up, and not on foods and drinks that will leave you hungry.  Use smaller plates, glasses, and bowls to prevent overeating. This information is not intended to replace advice given to you by your health care provider. Make sure you discuss any questions you have with your health care provider. Document Released: 11/07/2005 Document Revised: 07/27/2018 Document Reviewed: 10/07/2016 Elsevier Interactive Patient Education  2019 [employer] Prediabetes Eating Plan Prediabetes is a condition that causes blood sugar (glucose) levels to  be higher than normal. This increases the risk for developing diabetes. In order to prevent diabetes from developing, your health care provider may recommend a diet and other lifestyle changes to help you:  Control your blood glucose levels.  Improve your cholesterol levels.  Manage your blood pressure. Your health care provider may recommend working with a diet and nutrition specialist (dietitian) to make a meal plan that is best for you. What are tips for following this plan? Lifestyle  Set weight loss goals with the help of your health care team. It is recommended that most people with prediabetes lose 7% of their current body weight.  Exercise for at least 30 minutes at least 5 days a week.  Attend a support group or seek ongoing support from a mental health counselor.  Take over-the-counter and prescription medicines only as told by your health care provider. Reading food labels  Read food labels to check the amount of fat, salt (sodium), and sugar in prepackaged foods. Avoid foods that have: ? Saturated fats. ? Trans fats. ? Added sugars.  Avoid foods that have more than 300 milligrams (mg) of sodium per serving. Limit your daily sodium intake to less than 2,300 mg each day. Shopping  Avoid buying pre-made and processed foods. Cooking  Cook with olive oil. Do not use butter, lard, or ghee.  Bake, broil, grill, or boil foods. Avoid frying. Meal planning   Work with your dietitian to develop an eating plan that is right for you. This may include: ? Tracking how many calories you take in. Use a food diary, notebook, or mobile application to track what you eat at each meal. ? Using the glycemic index (GI) to plan your meals. The index tells you how quickly a food will raise your blood glucose. Choose low-GI foods. These foods take a longer time to raise blood glucose.  Consider following a Mediterranean diet. This diet includes: ? Several servings each day of fresh fruits  and vegetables. ? Eating fish at least twice a week. ? Several servings each day of whole grains, beans, nuts, and seeds. ? Using olive oil instead of other fats. ? Moderate alcohol consumption. ? Eating small amounts of red meat and whole-fat dairy.  If you have high blood pressure, you may need to limit your sodium intake or follow a diet such as the DASH eating plan. DASH is an eating plan that aims to lower high blood pressure. What foods are recommended? The items listed below may not be a complete list. Talk with your dietitian about what dietary choices are best for you. Grains Whole grains, such as whole-wheat or whole-grain breads, crackers, cereals, and pasta. Unsweetened oatmeal. Bulgur. Barley. Quinoa. Brown rice. Corn or whole-wheat flour tortillas or taco shells. Vegetables Lettuce. Spinach. Peas. Beets. Cauliflower. Cabbage. Broccoli. Carrots. Tomatoes. Squash. Eggplant. Herbs. Peppers. Onions. Cucumbers. Brussels sprouts. Fruits Berries. Bananas. Apples. Oranges. Grapes. Papaya. Mango. Pomegranate. Kiwi. Grapefruit. Cherries. Meats and other protein foods Seafood. Poultry without skin. Lean cuts of pork and beef. Tofu. Eggs. Nuts. Beans. Dairy Low-fat or fat-free dairy products, such as  yogurt, cottage cheese, and cheese. Beverages Water. Tea. Coffee. Sugar-free or diet soda. Seltzer water. Lowfat or no-fat milk. Milk alternatives, such as soy or almond milk. Fats and oils Olive oil. Canola oil. Sunflower oil. Grapeseed oil. Avocado. Walnuts. Sweets and desserts Sugar-free or low-fat pudding. Sugar-free or low-fat ice cream and other frozen treats. Seasoning and other foods Herbs. Sodium-free spices. Mustard. Relish. Low-fat, low-sugar ketchup. Low-fat, low-sugar barbecue sauce. Low-fat or fat-free mayonnaise. What foods are not recommended? The items listed below may not be a complete list. Talk with your dietitian about what dietary choices are best for  you. Grains Refined white flour and flour products, such as bread, pasta, snack foods, and cereals. Vegetables Canned vegetables. Frozen vegetables with butter or cream sauce. Fruits Fruits canned with syrup. Meats and other protein foods Fatty cuts of meat. Poultry with skin. Breaded or fried meat. Processed meats. Dairy Full-fat yogurt, cheese, or milk. Beverages Sweetened drinks, such as sweet iced tea and soda. Fats and oils Butter. Lard. Ghee. Sweets and desserts Baked goods, such as cake, cupcakes, pastries, cookies, and cheesecake. Seasoning and other foods Spice mixes with added salt. Ketchup. Barbecue sauce. Mayonnaise. Summary  To prevent diabetes from developing, you may need to make diet and other lifestyle changes to help control blood sugar, improve cholesterol levels, and manage your blood pressure.  Set weight loss goals with the help of your health care team. It is recommended that most people with prediabetes lose 7 percent of their current body weight.  Consider following a Mediterranean diet that includes plenty of fresh fruits and vegetables, whole grains, beans, nuts, seeds, fish, lean meat, low-fat dairy, and healthy oils. This information is not intended to replace advice given to you by your health care provider. Make sure you discuss any questions you have with your health care provider. Document Released: 03/24/2015 Document Revised: 01/11/2017 Document Reviewed: 01/11/2017 Elsevier Interactive Patient Education  2019 [employer]. Hypertension Hypertension, commonly called high blood pressure, is when the force of blood pumping through the arteries is too strong. The arteries are the blood vessels that carry blood from the heart throughout the body. Hypertension forces the heart to work harder to pump blood and may cause arteries to become narrow or stiff. Having untreated or uncontrolled hypertension can cause heart attacks, strokes, kidney disease, and  other problems. A blood pressure reading consists of a higher number over a lower number. Ideally, your blood pressure should be below 120/80. The first ("top") number is called the systolic pressure. It is a measure of the pressure in your arteries as your heart beats. The second ("bottom") number is called the diastolic pressure. It is a measure of the pressure in your arteries as the heart relaxes. What are the causes? The cause of this condition is not known. What increases the risk? Some risk factors for high blood pressure are under your control. Others are not. Factors you can change  Smoking.  Having type 2 diabetes mellitus, high cholesterol, or both.  Not getting enough exercise or physical activity.  Being overweight.  Having too much fat, sugar, calories, or salt (sodium) in your diet.  Drinking too much alcohol. Factors that are difficult or impossible to change  Having chronic kidney disease.  Having a family history of high blood pressure.  Age. Risk increases with age.  Race. You may be at higher risk if you are African-American.  Gender. Men are at higher risk than women before age 61. After age 54, women  are at higher risk than men.  Having obstructive sleep apnea.  Stress. What are the signs or symptoms? Extremely high blood pressure (hypertensive crisis) may cause:  Headache.  Anxiety.  Shortness of breath.  Nosebleed.  Nausea and vomiting.  Severe chest pain.  Jerky movements you cannot control (seizures). How is this diagnosed? This condition is diagnosed by measuring your blood pressure while you are seated, with your arm resting on a surface. The cuff of the blood pressure monitor will be placed directly against the skin of your upper arm at the level of your heart. It should be measured at least twice using the same arm. Certain conditions can cause a difference in blood pressure between your right and left arms. Certain factors can cause  blood pressure readings to be lower or higher than normal (elevated) for a short period of time:  When your blood pressure is higher when you are in a health care provider's office than when you are at home, this is called white coat hypertension. Most people with this condition do not need medicines.  When your blood pressure is higher at home than when you are in a health care provider's office, this is called masked hypertension. Most people with this condition may need medicines to control blood pressure. If you have a high blood pressure reading during one visit or you have normal blood pressure with other risk factors:  You may be asked to return on a different day to have your blood pressure checked again.  You may be asked to monitor your blood pressure at home for 1 week or longer. If you are diagnosed with hypertension, you may have other blood or imaging tests to help your health care provider understand your overall risk for other conditions. How is this treated? This condition is treated by making healthy lifestyle changes, such as eating healthy foods, exercising more, and reducing your alcohol intake. Your health care provider may prescribe medicine if lifestyle changes are not enough to get your blood pressure under control, and if:  Your systolic blood pressure is above 130.  Your diastolic blood pressure is above 80. Your personal target blood pressure may vary depending on your medical conditions, your age, and other factors. Follow these instructions at home: Eating and drinking   Eat a diet that is high in fiber and potassium, and low in sodium, added sugar, and fat. An example eating plan is called the DASH (Dietary Approaches to Stop Hypertension) diet. To eat this way: ? Eat plenty of fresh fruits and vegetables. Try to fill half of your plate at each meal with fruits and vegetables. ? Eat whole grains, such as whole wheat pasta, brown rice, or whole grain bread. Fill  about one quarter of your plate with whole grains. ? Eat or drink low-fat dairy products, such as skim milk or low-fat yogurt. ? Avoid fatty cuts of meat, processed or cured meats, and poultry with skin. Fill about one quarter of your plate with lean proteins, such as fish, chicken without skin, beans, eggs, and tofu. ? Avoid premade and processed foods. These tend to be higher in sodium, added sugar, and fat.  Reduce your daily sodium intake. Most people with hypertension should eat less than 1,500 mg of sodium a day.  Limit alcohol intake to no more than 1 drink a day for nonpregnant women and 2 drinks a day for men. One drink equals 12 oz of beer, 5 oz of wine, or 1 oz of  hard liquor. Lifestyle   Work with your health care provider to maintain a healthy body weight or to lose weight. Ask what an ideal weight is for you.  Get at least 30 minutes of exercise that causes your heart to beat faster (aerobic exercise) most days of the week. Activities may include walking, swimming, or biking.  Include exercise to strengthen your muscles (resistance exercise), such as pilates or lifting weights, as part of your weekly exercise routine. Try to do these types of exercises for 30 minutes at least 3 days a week.  Do not use any products that contain nicotine or tobacco, such as cigarettes and e-cigarettes. If you need help quitting, ask your health care provider.  Monitor your blood pressure at home as told by your health care provider.  Keep all follow-up visits as told by your health care provider. This is important. Medicines  Take over-the-counter and prescription medicines only as told by your health care provider. Follow directions carefully. Blood pressure medicines must be taken as prescribed.  Do not skip doses of blood pressure medicine. Doing this puts you at risk for problems and can make the medicine less effective.  Ask your health care provider about side effects or reactions to  medicines that you should watch for. Contact a health care provider if:  You think you are having a reaction to a medicine you are taking.  You have headaches that keep coming back (recurring).  You feel dizzy.  You have swelling in your ankles.  You have trouble with your vision. Get help right away if:  You develop a severe headache or confusion.  You have unusual weakness or numbness.  You feel faint.  You have severe pain in your chest or abdomen.  You vomit repeatedly.  You have trouble breathing. Summary  Hypertension is when the force of blood pumping through your arteries is too strong. If this condition is not controlled, it may put you at risk for serious complications.  Your personal target blood pressure may vary depending on your medical conditions, your age, and other factors. For most people, a normal blood pressure is less than 120/80.  Hypertension is treated with lifestyle changes, medicines, or a combination of both. Lifestyle changes include weight loss, eating a healthy, low-sodium diet, exercising more, and limiting alcohol. This information is not intended to replace advice given to you by your health care provider. Make sure you discuss any questions you have with your health care provider. Document Released: 11/07/2005 Document Revised: 10/05/2016 Document Reviewed: 10/05/2016 Elsevier Interactive Patient Education  2019 [employer]. Hydrochlorothiazide, HCTZ capsules or tablets What is this medicine? HYDROCHLOROTHIAZIDE (hye droe klor oh THYE a zide) is a diuretic. It increases the amount of urine passed, which causes the body to lose salt and water. This medicine is used to treat high blood pressure. It is also reduces the swelling and water retention caused by various medical conditions, such as heart, liver, or kidney disease. This medicine may be used for other purposes; ask your health care provider or pharmacist if you have questions. COMMON  BRAND NAME(S): Esidrix, Ezide, HydroDIURIL, Microzide, Oretic, Zide What should I tell my health care provider before I take this medicine? They need to know if you have any of these conditions: -diabetes -gout -immune system problems, like lupus -kidney disease or kidney stones -liver disease -pancreatitis -small amount of urine or difficulty passing urine -an unusual or allergic reaction to hydrochlorothiazide, sulfa drugs, other medicines, foods, dyes, or  preservatives -pregnant or trying to get pregnant -breast-feeding How should I use this medicine? Take this medicine by mouth with a glass of water. Follow the directions on the prescription label. Take your medicine at regular intervals. Remember that you will need to pass urine frequently after taking this medicine. Do not take your doses at a time of day that will cause you problems. Do not stop taking your medicine unless your doctor tells you to. Talk to your pediatrician regarding the use of this medicine in children. Special care may be needed. Overdosage: If you think you have taken too much of this medicine contact a poison control center or emergency room at once. NOTE: This medicine is only for you. Do not share this medicine with others. What if I miss a dose? If you miss a dose, take it as soon as you can. If it is almost time for your next dose, take only that dose. Do not take double or extra doses. What may interact with this medicine? -cholestyramine -colestipol -digoxin -dofetilide -lithium -medicines for blood pressure -medicines for diabetes -medicines that relax muscles for surgery -other diuretics -steroid medicines like prednisone or cortisone This list may not describe all possible interactions. Give your health care provider a list of all the medicines, herbs, non-prescription drugs, or dietary supplements you use. Also tell them if you smoke, drink alcohol, or use illegal drugs. Some items may interact  with your medicine. What should I watch for while using this medicine? Visit your doctor or health care professional for regular checks on your progress. Check your blood pressure as directed. Ask your doctor or health care professional what your blood pressure should be and when you should contact him or her. You may need to be on a special diet while taking this medicine. Ask your doctor. Check with your doctor or health care professional if you get an attack of severe diarrhea, nausea and vomiting, or if you sweat a lot. The loss of too much body fluid can make it dangerous for you to take this medicine. You may get drowsy or dizzy. Do not drive, use machinery, or do anything that needs mental alertness until you know how this medicine affects you. Do not stand or sit up quickly, especially if you are an older patient. This reduces the risk of dizzy or fainting spells. Alcohol may interfere with the effect of this medicine. Avoid alcoholic drinks. This medicine may affect your blood sugar level. If you have diabetes, check with your doctor or health care professional before changing the dose of your diabetic medicine. This medicine can make you more sensitive to the sun. Keep out of the sun. If you cannot avoid being in the sun, wear protective clothing and use sunscreen. Do not use sun lamps or tanning beds/booths. What side effects may I notice from receiving this medicine? Side effects that you should report to your doctor or health care professional as soon as possible: -allergic reactions such as skin rash or itching, hives, swelling of the lips, mouth, tongue, or throat -changes in vision -chest pain -eye pain -fast or irregular heartbeat -feeling faint or lightheaded, falls -gout attack -muscle pain or cramps -pain or difficulty when passing urine -pain, tingling, numbness in the hands or feet -redness, blistering, peeling or loosening of the skin, including inside the mouth -unusually  weak or tired Side effects that usually do not require medical attention (report to your doctor or health care professional if they continue or are bothersome): -  change in sex drive or performance -dry mouth -headache -stomach upset This list may not describe all possible side effects. Call your doctor for medical advice about side effects. You may report side effects to FDA at 1-800-FDA-1088. Where should I keep my medicine? Keep out of the reach of children. Store at room temperature between 15 and 30 degrees C (59 and 86 degrees F). Do not freeze. Protect from light and moisture. Keep container closed tightly. Throw away any unused medicine after the expiration date. NOTE: This sheet is a summary. It may not cover all possible information. If you have questions about this medicine, talk to your doctor, pharmacist, or health care provider.  2019 Elsevier/Gold Standard (2010-07-02 12:57:37) Cyclobenzaprine tablets What is this medicine? CYCLOBENZAPRINE (sye kloe BEN za preen) is a muscle relaxer. It is used to treat muscle pain, spasms, and stiffness. This medicine may be used for other purposes; ask your health care provider or pharmacist if you have questions. COMMON BRAND NAME(S): Fexmid, Flexeril What should I tell my health care provider before I take this medicine? They need to know if you have any of these conditions: -heart disease, irregular heartbeat, or previous heart attack -liver disease -thyroid problem -an unusual or allergic reaction to cyclobenzaprine, tricyclic antidepressants, lactose, other medicines, foods, dyes, or preservatives -pregnant or trying to get pregnant -breast-feeding How should I use this medicine? Take this medicine by mouth with a glass of water. Follow the directions on the prescription label. If this medicine upsets your stomach, take it with food or milk. Take your medicine at regular intervals. Do not take it more often than directed. Talk to your  pediatrician regarding the use of this medicine in children. Special care may be needed. Overdosage: If you think you have taken too much of this medicine contact a poison control center or emergency room at once. NOTE: This medicine is only for you. Do not share this medicine with others. What if I miss a dose? If you miss a dose, take it as soon as you can. If it is almost time for your next dose, take only that dose. Do not take double or extra doses. What may interact with this medicine? Do not take this medicine with any of the following medications: -MAOIs like Carbex, Eldepryl, Marplan, Nardil, and Parnate This medicine may also interact with the following medications: -alcohol -antihistamines for allergy, cough, and cold -certain medicines for anxiety or sleep -certain medicines for depression like amitriptyline, fluoxetine, sertraline -certain medicines for seizures like phenobarbital, primidone -contrast dyes -local anesthetics like lidocaine, pramoxine, tetracaine -medicines that relax muscles for surgery -narcotic medicines for pain -phenothiazines like chlorpromazine, mesoridazine, prochlorperazine This list may not describe all possible interactions. Give your health care provider a list of all the medicines, herbs, non-prescription drugs, or dietary supplements you use. Also tell them if you smoke, drink alcohol, or use illegal drugs. Some items may interact with your medicine. What should I watch for while using this medicine? Tell your doctor or health care professional if your symptoms do not start to get better or if they get worse. You may get drowsy or dizzy. Do not drive, use machinery, or do anything that needs mental alertness until you know how this medicine affects you. Do not stand or sit up quickly, especially if you are an older patient. This reduces the risk of dizzy or fainting spells. Alcohol may interfere with the effect of this medicine. Avoid alcoholic  drinks. If you are taking another  medicine that also causes drowsiness, you may have more side effects. Give your health care provider a list of all medicines you use. Your doctor will tell you how much medicine to take. Do not take more medicine than directed. Call emergency for help if you have problems breathing or unusual sleepiness. Your mouth may get dry. Chewing sugarless gum or sucking hard candy, and drinking plenty of water may help. Contact your doctor if the problem does not go away or is severe. What side effects may I notice from receiving this medicine? Side effects that you should report to your doctor or health care professional as soon as possible: -allergic reactions like skin rash, itching or hives, swelling of the face, lips, or tongue -breathing problems -chest pain -fast, irregular heartbeat -hallucinations -seizures -unusually weak or tired Side effects that usually do not require medical attention (report to your doctor or health care professional if they continue or are bothersome): -headache -nausea, vomiting This list may not describe all possible side effects. Call your doctor for medical advice about side effects. You may report side effects to FDA at 1-800-FDA-1088. Where should I keep my medicine? Keep out of the reach of children. Store at room temperature between 15 and 30 degrees C (59 and 86 degrees F). Keep container tightly closed. Throw away any unused medicine after the expiration date. NOTE: This sheet is a summary. It may not cover all possible information. If you have questions about this medicine, talk to your doctor, pharmacist, or health care provider.  2019 Elsevier/Gold Standard (2017-08-30 13:04:35) Ibuprofen tablets and capsules What is this medicine? IBUPROFEN (eye BYOO proe fen) is a non-steroidal anti-inflammatory drug (NSAID). It is used for dental pain, fever, headaches or migraines, osteoarthritis, rheumatoid arthritis, or painful  monthly periods. It can also relieve minor aches and pains caused by a cold, flu, or sore throat. This medicine may be used for other purposes; ask your health care provider or pharmacist if you have questions. COMMON BRAND NAME(S): Advil, Advil Junior Strength, Advil Migraine, Genpril, Ibren, IBU, Midol, Midol Cramps and Body Aches, Motrin, Motrin IB, Motrin Junior Strength, Motrin Migraine Pain, Samson-8, Toxicology Saliva Collection What should I tell my health care provider before I take this medicine? They need to know if you have any of these conditions: -cigarette smoker -coronary artery bypass graft (CABG) surgery within the past 2 weeks -drink more than 3 alcohol-containing drinks a day -heart disease -high blood pressure -history of stomach bleeding -kidney disease -liver disease -lung or breathing disease, like asthma -an unusual or allergic reaction to ibuprofen, aspirin, other NSAIDs, other medicines, foods, dyes, or preservatives -pregnant or trying to get pregnant -breast-feeding How should I use this medicine? Take this medicine by mouth with a glass of water. Follow the directions on the prescription label. Take this medicine with food if your stomach gets upset. Try to not lie down for at least 10 minutes after you take the medicine. Take your medicine at regular intervals. Do not take your medicine more often than directed. A special MedGuide will be given to you by the pharmacist with each prescription and refill. Be sure to read this information carefully each time. Talk to your pediatrician regarding the use of this medicine in children. Special care may be needed. Overdosage: If you think you have taken too much of this medicine contact a poison control center or emergency room at once. NOTE: This medicine is only for you. Do not share this medicine with others. What  if I miss a dose? If you miss a dose, take it as soon as you can. If it is almost time for your next  dose, take only that dose. Do not take double or extra doses. What may interact with this medicine? Do not take this medicine with any of the following medications: -cidofovir -ketorolac -methotrexate -pemetrexed This medicine may also interact with the following medications: -alcohol -aspirin -diuretics -lithium -other drugs for inflammation like prednisone -warfarin This list may not describe all possible interactions. Give your health care provider a list of all the medicines, herbs, non-prescription drugs, or dietary supplements you use. Also tell them if you smoke, drink alcohol, or use illegal drugs. Some items may interact with your medicine. What should I watch for while using this medicine? Tell your doctor or healthcare professional if your symptoms do not start to get better or if they get worse. This medicine does not prevent heart attack or stroke. In fact, this medicine may increase the chance of a heart attack or stroke. The chance may increase with longer use of this medicine and in people who have heart disease. If you take aspirin to prevent heart attack or stroke, talk with your doctor or health care professional. Do not take other medicines that contain aspirin, ibuprofen, or naproxen with this medicine. Side effects such as stomach upset, nausea, or ulcers may be more likely to occur. Many medicines available without a prescription should not be taken with this medicine. This medicine can cause ulcers and bleeding in the stomach and intestines at any time during treatment. Ulcers and bleeding can happen without warning symptoms and can cause death. To reduce your risk, do not smoke cigarettes or drink alcohol while you are taking this medicine. You may get drowsy or dizzy. Do not drive, use machinery, or do anything that needs mental alertness until you know how this medicine affects you. Do not stand or sit up quickly, especially if you are an older patient. This reduces the  risk of dizzy or fainting spells. This medicine can cause you to bleed more easily. Try to avoid damage to your teeth and gums when you brush or floss your teeth. This medicine may be used to treat migraines. If you take migraine medicines for 10 or more days a month, your migraines may get worse. Keep a diary of headache days and medicine use. Contact your healthcare professional if your migraine attacks occur more frequently. What side effects may I notice from receiving this medicine? Side effects that you should report to your doctor or health care professional as soon as possible: -allergic reactions like skin rash, itching or hives, swelling of the face, lips, or tongue -severe stomach pain -signs and symptoms of bleeding such as bloody or black, tarry stools; red or dark-brown urine; spitting up blood or brown material that looks like coffee grounds; red spots on the skin; unusual bruising or bleeding from the eye, gums, or nose -signs and symptoms of a blood clot such as changes in vision; chest pain; severe, sudden headache; trouble speaking; sudden numbness or weakness of the face, arm, or leg -unexplained weight gain or swelling -unusually weak or tired -yellowing of eyes or skin Side effects that usually do not require medical attention (report to your doctor or health care professional if they continue or are bothersome): -bruising -diarrhea -dizziness, drowsiness -headache -nausea, vomiting This list may not describe all possible side effects. Call your doctor for medical advice about side effects. You may  report side effects to FDA at 1-800-FDA-1088. Where should I keep my medicine? Keep out of the reach of children. Store at room temperature between 15 and 30 degrees C (59 and 86 degrees F). Keep container tightly closed. Throw away any unused medicine after the expiration date. NOTE: This sheet is a summary. It may not cover all possible information. If you have questions about  this medicine, talk to your doctor, pharmacist, or health care provider.  2019 Elsevier/Gold Standard (2017-07-12 12:43:57)

## 2019-03-26 NOTE — ED Triage Notes (Signed)
Pt reports being at work and then felt hot and started sweating. Pt reports hx of HTN but not currently taking any medications.

## 2019-03-26 NOTE — ED Notes (Signed)
Patient transported to CT 

## 2019-03-26 NOTE — Progress Notes (Signed)
[facility]    New Patient--Hospital Follow Up--Establish Care   Subjective:  Patient ID: Destiny Cook, female    DOB: 01-27-1986  Age: 33 y.o. MRN: [id number]  CC:  Chief Complaint  Patient presents with  . Establish Care  . Hospitalization Follow-up  . Panic Attack  . Hot Flashes    lots of sweating     HPI Destiny Cook is a 33 year old female who presents for Hospital Follow up and to Establish Care today.   Past Medical History:  Diagnosis Date  . Anxiety   . Chronic back pain   . Chronic left hip pain   . Dental caries   . Hypertension    Current Status: Since her last ED visit on today, (03/26/2019), for shortness of breath. She states that she is feeling better today. She believes that it is caused by her intense Anxiety. She denies suicidal ideations, homicidal ideations, or auditory hallucinations.She denies visual changes, chest pain, cough, shortness of breath, heart palpitations, and falls. She has occasional headaches and dizziness with position changes. Denies severe headaches, confusion, seizures, double vision, and blurred vision, nausea and vomiting. She is currently on having her menstrual period.   She denies fevers, chills, fatigue, recent infections, weight loss, and night sweats. No reports of GI problems such as diarrhea, and constipation. She has no reports of blood in stools, dysuria and hematuria. She denies pain today.   Past Surgical History:  Procedure Laterality Date  . CESAREAN SECTION     x 4  . TUBAL LIGATION      Family History  Problem Relation Age of Onset  . Hypertension Mother     Social History   Socioeconomic History  . Marital status: Single    Spouse name: Not on file  . Number of children: Not on file  . Years of education: Not on file  . Highest education level: Not on file  Occupational History  . Not on file  Social Needs  . Financial resource strain: Not on file   . Food insecurity:    Worry: Not on file    Inability: Not on file  . Transportation needs:    Medical: Not on file    Non-medical: Not on file  Tobacco Use  . Smoking status: Current Every Day Smoker    Packs/day: 0.50    Years: 15.00    Pack years: 7.50    Types: Cigarettes  . Smokeless tobacco: Never Used  . Tobacco comment: started age 83  Substance and Sexual Activity  . Alcohol use: No  . Drug use: No  . Sexual activity: Yes    Partners: Male    Birth control/protection: Surgical  Lifestyle  . Physical activity:    Days per week: Not on file    Minutes per session: Not on file  . Stress: Not on file  Relationships  . Social connections:    Talks on phone: Not on file    Gets together: Not on file    Attends religious service: Not on file    Active member of club or organization: Not on file    Attends meetings of clubs or organizations: Not on file    Relationship status: Not on file  . Intimate partner violence:    Fear of current or ex partner: Not on file    Emotionally abused: Not on file    Physically abused: Not on file  Forced sexual activity: Not on file  Other Topics Concern  . Not on file  Social History Narrative  . Not on file    Outpatient Medications Prior to Visit  Medication Sig Dispense Refill  . methocarbamol (ROBAXIN) 500 MG tablet Take 1 tablet (500 mg total) by mouth at bedtime and may repeat dose one time if needed. 10 tablet 0  . traMADol (ULTRAM) 50 MG tablet Take 1 tablet (50 mg total) by mouth every 6 (six) hours as needed. 15 tablet 0  . metroNIDAZOLE (FLAGYL) 500 MG tablet Take 1 tablet (500 mg total) by mouth 2 (two) times daily. One po bid x 7 days (Patient not taking: Reported on 03/26/2019) 14 tablet 0   No facility-administered medications prior to visit.     Allergies  Allergen Reactions  . Penicillins Itching and Rash    ROS Review of Systems  Constitutional: Negative.   HENT: Positive for dental problem (Dental  Caries).   Eyes: Negative.   Respiratory: Positive for shortness of breath (Occasional).   Cardiovascular: Negative.   Gastrointestinal: Negative.   Endocrine: Negative.   Genitourinary: Negative.        Menstrual Period.   Musculoskeletal: Negative.   Skin: Negative.   Allergic/Immunologic: Negative.   Neurological: Positive for dizziness and headaches.  Hematological: Negative.   Psychiatric/Behavioral: Negative.    Objective:    Physical Exam  Constitutional: She is oriented to person, place, and time. She appears well-developed and well-nourished.  HENT:  Head: Normocephalic and atraumatic.  Eyes: Conjunctivae are normal.  Neck: Normal range of motion. Neck supple.  Cardiovascular: Normal rate, regular rhythm, normal heart sounds and intact distal pulses.  Pulmonary/Chest: Effort normal and breath sounds normal.  Abdominal: Soft. Bowel sounds are normal.  Musculoskeletal: Normal range of motion.  Neurological: She is alert and oriented to person, place, and time. She has normal reflexes.  Skin: Skin is warm and dry.  Psychiatric: She has a normal mood and affect. Her behavior is normal. Judgment and thought content normal.  Nursing note and vitals reviewed.   BP (!) 142/90 (BP Location: Left Arm, Patient Position: Sitting, Cuff Size: Large)   Pulse 84   Temp 97.9 F (36.6 C) (Oral)   Ht  (1.6 m)   Wt 262 lb (118.8 kg)   LMP 03/25/2019   SpO2 99%   BMI 46.41 kg/m  Wt Readings from Last 3 Encounters:  03/26/19 262 lb (118.8 kg)  03/26/19 250 lb (113.4 kg)  03/20/19 250 lb (113.4 kg)     Health Maintenance Due  Topic Date Due  . PAP SMEAR-Modifier  07/01/2007    There are no preventive care reminders to display for this patient.  No results found for: TSH Lab Results  Component Value Date   WBC 8.5 03/26/2019   HGB 12.3 03/26/2019   HCT 37.5 03/26/2019   MCV 84.1 03/26/2019   PLT 274 03/26/2019   Lab Results  Component Value Date   NA 138  03/26/2019   K 3.6 03/26/2019   CO2 22 03/26/2019   GLUCOSE 108 (H) 03/26/2019   BUN 14 03/26/2019   CREATININE 0.94 03/26/2019   BILITOT 0.2 (L) 03/12/2019   ALKPHOS 65 03/12/2019   AST 22 03/12/2019   ALT 26 03/12/2019   PROT 6.7 03/12/2019   ALBUMIN 3.7 03/12/2019   CALCIUM 8.6 (L) 03/26/2019   ANIONGAP 8 03/26/2019   No results found for: CHOL No results found for: HDL No results found for:  LDLCALC No results found for: TRIG No results found for: CHOLHDL Lab Results  Component Value Date   HGBA1C 5.9 (A) 03/26/2019    Assessment & Plan:   1. Hospital discharge follow-up  2. Encounter to establish care  3. Anxiety Stable today. She will practice relaxing techniques such as breathing exercises.   4. Shortness of breath Stable today. No   5. Hypertension, unspecified type Blood pressure is stable at 142/90 today. We will initiate HCTZ today. She will continue to decrease high sodium intake, excessive alcohol intake, increase potassium intake, smoking cessation, and increase physical activity of at least 30 minutes of cardio activity daily. She will continue to follow Heart Healthy or DASH diet. - hydrochlorothiazide (HYDRODIURIL) 25 MG tablet; Take 1 tablet (25 mg total) by mouth daily.  Dispense: 30 tablet; Refill: 1  6. Chronic back pain, unspecified back location, unspecified back pain laterality We will initiate Motrin today.  - ibuprofen (ADVIL) 800 MG tablet; Take 1 tablet (800 mg total) by mouth every 8 (eight) hours as needed.  Dispense: 30 tablet; Refill: 1 - cyclobenzaprine (FLEXERIL) 10 MG tablet; Take 1 tablet (10 mg total) by mouth 3 (three) times daily as needed for muscle spasms.  Dispense: 30 tablet; Refill: 1  7. Dental caries Multiple dental caries. No pain reported.  - Ambulatory referral to Dentistry  8. Screening for diabetes mellitus Hgb A1c is stable at 5.9 today. Counseled given to patient r/t Diabetes. We will initiate Metformin today to  also be used for weight loss. She will continue to decrease foods/beverages high in sugars and carbs and follow Heart Healthy or DASH diet. Increase physical activity to at least 30 minutes cardio exercise daily.  - POCT glycosylated hemoglobin (Hb A1C) - POCT urinalysis dipstick  9. Need for diphtheria-tetanus-pertussis (Tdap) vaccine - Tdap vaccine greater than or equal to 7yo IM  10. Prediabetes - metFORMIN (GLUCOPHAGE) 500 MG tablet; Take 1 tablet (500 mg total) by mouth 2 (two) times daily with a meal.  Dispense: 60 tablet; Refill: 1  11. Obese Body mass index is 46.41 kg/m. Goal BMI  is <30. We will initiate Metformin today. Encouraged efforts to reduce weight include engaging in physical activity as tolerated with goal of 150 minutes per week. Improve dietary choices and eat a meal regimen consistent with a Mediterranean or DASH diet. Reduce simple carbohydrates. Do not skip meals and eat healthy snacks throughout the day to avoid over-eating at dinner. Set a goal weight loss that is achievable for you.  12. Follow up She will follow up in 1 month.   Meds ordered this encounter  Medications  . hydrochlorothiazide (HYDRODIURIL) 25 MG tablet    Sig: Take 1 tablet (25 mg total) by mouth daily.    Dispense:  30 tablet    Refill:  1  . metFORMIN (GLUCOPHAGE) 500 MG tablet    Sig: Take 1 tablet (500 mg total) by mouth 2 (two) times daily with a meal.    Dispense:  60 tablet    Refill:  1  . ibuprofen (ADVIL) 800 MG tablet    Sig: Take 1 tablet (800 mg total) by mouth every 8 (eight) hours as needed.    Dispense:  30 tablet    Refill:  1  . cyclobenzaprine (FLEXERIL) 10 MG tablet    Sig: Take 1 tablet (10 mg total) by mouth 3 (three) times daily as needed for muscle spasms.    Dispense:  30 tablet    Refill:  1   Orders Placed This Encounter  Procedures  . Tdap vaccine greater than or equal to 7yo IM  . Ambulatory referral to Dentistry  . POCT glycosylated hemoglobin (Hb A1C)   . POCT urinalysis dipstick     Referral Orders     Ambulatory referral to Dentistry   Raliegh IpNatalie Mehul Rudin,  MSN, FNP-C [facility] [address]  [place], [place] [id number] [phone]   Problem List Items Addressed This Visit    None    Visit Diagnoses    Hospital discharge follow-up    -  Primary   Encounter to establish care       Anxiety       Shortness of breath       Hypertension, unspecified type       Relevant Medications   hydrochlorothiazide (HYDRODIURIL) 25 MG tablet   Chronic back pain, unspecified back location, unspecified back pain laterality       Relevant Medications   ibuprofen (ADVIL) 800 MG tablet   cyclobenzaprine (FLEXERIL) 10 MG tablet   Dental caries       Relevant Orders   Ambulatory referral to Dentistry   Screening for diabetes mellitus       Relevant Orders   POCT glycosylated hemoglobin (Hb A1C) (Completed)   POCT urinalysis dipstick (Completed)   Need for diphtheria-tetanus-pertussis (Tdap) vaccine       Relevant Orders   Tdap vaccine greater than or equal to 7yo IM (Completed)   Prediabetes       Relevant Medications   metFORMIN (GLUCOPHAGE) 500 MG tablet   Follow up          Meds ordered this encounter  Medications  . hydrochlorothiazide (HYDRODIURIL) 25 MG tablet    Sig: Take 1 tablet (25 mg total) by mouth daily.    Dispense:  30 tablet    Refill:  1  . metFORMIN (GLUCOPHAGE) 500 MG tablet    Sig: Take 1 tablet (500 mg total) by mouth 2 (two) times daily with a meal.    Dispense:  60 tablet    Refill:  1  . ibuprofen (ADVIL) 800 MG tablet    Sig: Take 1 tablet (800 mg total) by mouth every 8 (eight) hours as needed.    Dispense:  30 tablet    Refill:  1  . cyclobenzaprine (FLEXERIL) 10 MG tablet    Sig: Take 1 tablet (10 mg total) by mouth 3 (three) times daily as needed for muscle spasms.    Dispense:  30 tablet    Refill:  1    Follow-up: Return in about 1 month (around 04/26/2019).     Kallie LocksNatalie M Niall Illes, FNP

## 2019-03-26 NOTE — Discharge Instructions (Signed)
Work-up today was negative. I recommend that you follow-up with primary care as soon as possible. Return here for any new/acute changes.

## 2019-03-26 NOTE — ED Notes (Signed)
Pt back from CT

## 2019-03-26 NOTE — ED Provider Notes (Signed)
[facility]-EMERGENCY DEPT Provider Note   CSN: [id number] Arrival date & time: 03/26/19  0006    History   Chief Complaint Chief Complaint  Patient presents with   Hypertension    HPI Destiny Cook is a 33 y.o. female.     The history is provided by the patient and medical records.  Hypertension  Associated symptoms include shortness of breath.     33 year old female with history of hypertension, presenting to the ED with elevated blood pressure and flushed sensation.  Patient states she just went back to work a few days ago, she works at [employer].  States she has been doing okay but the past few days while at work she has been feeling very flushed, hot, and short of breath.  She has not noticed any chest pain but states she does have some discomfort with deep breathing. States this only happens when she is at work, otherwise she feels fine.  States at home she always sits with a fan blowing directly on her, unsure if that is what makes the difference.  She has not had any cough, fever, sore throat, or known COVID exposures.  She has no history of asthma or other respiratory issues.  She does report history of HTN but states she is not currently on medications (last time she took them was 5+ years ago while living in [place]).  Past Medical History:  Diagnosis Date   Hypertension     There are no active problems to display for this patient.   Past Surgical History:  Procedure Laterality Date   CESAREAN SECTION     x 4   TUBAL LIGATION       OB History    Gravida  5   Para  4   Term  2   Preterm  2   AB  1   Living  4     SAB  1   TAB  0   Ectopic  0   Multiple  0   Live Births  4        Obstetric Comments  c-sect x 4         Home Medications    Prior to Admission medications   Medication Sig Start Date End Date Taking? Authorizing Provider  methocarbamol (ROBAXIN) 500 MG tablet Take 1 tablet (500 mg  total) by mouth at bedtime and may repeat dose one time if needed. 03/20/19   Bethel Born, PA-C  metroNIDAZOLE (FLAGYL) 500 MG tablet Take 1 tablet (500 mg total) by mouth 2 (two) times daily. One po bid x 7 days 03/12/19   Dartha Lodge, PA-C  traMADol (ULTRAM) 50 MG tablet Take 1 tablet (50 mg total) by mouth every 6 (six) hours as needed. 03/20/19   Bethel Born, PA-C    Family History History reviewed. No pertinent family history.  Social History Social History   Tobacco Use   Smoking status: Current Every Day Smoker    Packs/day: 0.50    Years: 15.00    Pack years: 7.50    Types: Cigarettes   Smokeless tobacco: Never Used   Tobacco comment: started age 9  Substance Use Topics   Alcohol use: No   Drug use: No     Allergies   Penicillins   Review of Systems Review of Systems  Respiratory: Positive for shortness of breath.   All other systems reviewed and are negative.    Physical Exam Updated  Vital Signs BP (!) 154/103 (BP Location: Left Arm)    Pulse (!) 105    Temp 98.1 F (36.7 C) (Oral)    Resp 17    Ht 5\' 3"  (1.6 m)    Wt 113.4 kg    LMP 03/25/2019    SpO2 100%    BMI 44.29 kg/m   Physical Exam Vitals signs and nursing note reviewed.  Constitutional:      Appearance: She is well-developed.     Comments: obese  HENT:     Head: Normocephalic and atraumatic.  Eyes:     Conjunctiva/sclera: Conjunctivae normal.     Pupils: Pupils are equal, round, and reactive to light.  Neck:     Musculoskeletal: Normal range of motion.  Cardiovascular:     Rate and Rhythm: Normal rate and regular rhythm.     Heart sounds: Normal heart sounds.  Pulmonary:     Effort: Pulmonary effort is normal.     Breath sounds: Normal breath sounds. No decreased breath sounds, wheezing or rhonchi.  Abdominal:     General: Bowel sounds are normal.     Palpations: Abdomen is soft.  Musculoskeletal: Normal range of motion.  Skin:    General: Skin is warm and dry.    Neurological:     Mental Status: She is alert and oriented to person, place, and time.      ED Treatments / Results  Labs (all labs ordered are listed, but only abnormal results are displayed) Labs Reviewed  BASIC METABOLIC PANEL - Abnormal; Notable for the following components:      Result Value   Glucose, Bld 108 (*)    Calcium 8.6 (*)    All other components within normal limits  D-DIMER, QUANTITATIVE (NOT AT [facility]) - Abnormal; Notable for the following components:   D-Dimer, Quant 0.57 (*)    All other components within normal limits  CBC WITH DIFFERENTIAL/PLATELET  TROPONIN I  I-STAT BETA HCG BLOOD, ED ([facility], [facility], [facility] ONLY)    EKG None  Radiology Ct Angio Chest Pe W And/or Wo Contrast  Result Date: 03/26/2019 CLINICAL DATA:  Shortness of breath, positive D-dimer. EXAM: CT ANGIOGRAPHY CHEST WITH CONTRAST TECHNIQUE: Multidetector CT imaging of the chest was performed using the standard protocol during bolus administration of intravenous contrast. Multiplanar CT image reconstructions and MIPs were obtained to evaluate the vascular anatomy. CONTRAST:  100mL OMNIPAQUE IOHEXOL 350 MG/ML SOLN COMPARISON:  Chest x-ray earlier today. FINDINGS: Cardiovascular: No filling defects in the pulmonary arteries to suggest pulmonary emboli. Heart is normal size. Aorta is normal caliber. No evidence of aortic dissection. Mediastinum/Nodes: No mediastinal, hilar, or axillary adenopathy. Trachea and esophagus are unremarkable. Thyroid unremarkable. Lungs/Pleura: Lungs are clear. No focal airspace opacities or suspicious nodules. No effusions. Upper Abdomen: Imaging into the upper abdomen shows no acute findings. Musculoskeletal: Chest wall soft tissues are unremarkable. No acute bony abnormality. Review of the MIP images confirms the above findings. IMPRESSION: No evidence of pulmonary embolus. No acute cardiopulmonary disease. Electronically Signed   By: Charlett NoseKevin  Dover M.D.   On: 03/26/2019 03:24   Dg  Chest Port 1 View  Result Date: 03/26/2019 CLINICAL DATA:  Shortness of breath EXAM: PORTABLE CHEST 1 VIEW COMPARISON:  None. FINDINGS: Heart and mediastinal contours are within normal limits. No focal opacities or effusions. No acute bony abnormality. IMPRESSION: No active disease. Electronically Signed   By: Charlett NoseKevin  Dover M.D.   On: 03/26/2019 00:51    Procedures Procedures (including critical care  time)  Medications Ordered in ED Medications - No data to display   Initial Impression / Assessment and Plan / ED Course  I have reviewed the triage vital signs and the nursing notes.  Pertinent labs & imaging results that were available during my care of the patient were reviewed by me and considered in my medical decision making (see chart for details).  33 year old female presenting to the ED with shortness of breath.  [place] and while walking around for the past few days she has noticed feeling flushed and short of breath.  She does not have any chest pain during this time but does notice some pain with deep breathing.  States this only happens at work.  At home she continuously has fan blowing on her, unsure if this is what makes it better at home.  She is not had any cough, fever, or known COVID exposures.  She is afebrile and nontoxic in appearance here.  She is hypertensive.  Reports history of same but has not been on medications in over 5 years.  EKG is nonischemic.  Plan for screening labs including d-dimer along with chest x-ray.  Labs are overall reassuring, troponin negative.  Chest x-ray is clear.  Her d-dimer is mildly elevated at 0.57.  CTA was obtained which is negative for acute PE or other focal findings.  Again, patient without any known COVID exposures, cough, or fever.  [place] so there is definite avenue for exposure, however no focal findings of this on imaging today. She remains without hypoxia here and BP  has improved without intervention, now 116/79.  Symptoms not concerning for ACS.  As she will not require hospitalization or escalated care, COVID testing will not be performed.  Feel she is stable for discharge.  She will need follow-up with PCP.  She can return here for any new/acute changes.  Elefteria Mungovan was evaluated in Emergency Department on 03/26/2019 for the symptoms described in the history of present illness. She was evaluated in the context of the global COVID-19 pandemic, which necessitated consideration that the patient might be at risk for infection with the SARS-CoV-2 virus that causes COVID-19. Institutional protocols and algorithms that pertain to the evaluation of patients at risk for COVID-19 are in a state of rapid change based on information released by regulatory bodies including the CDC and federal and state organizations. These policies and algorithms were followed during the patient's care in the ED.   Final Clinical Impressions(s) / ED Diagnoses   Final diagnoses:  Shortness of breath    ED Discharge Orders    None       Garlon Hatchet, PA-C 03/26/19 0431    Shon Baton, MD 03/26/19 [phone]

## 2019-03-27 ENCOUNTER — Encounter: Payer: Self-pay | Admitting: Family Medicine

## 2019-03-27 DIAGNOSIS — R0602 Shortness of breath: Secondary | ICD-10-CM | POA: Insufficient documentation

## 2019-03-27 DIAGNOSIS — I10 Essential (primary) hypertension: Secondary | ICD-10-CM | POA: Insufficient documentation

## 2019-03-27 DIAGNOSIS — G8929 Other chronic pain: Secondary | ICD-10-CM | POA: Insufficient documentation

## 2019-03-27 DIAGNOSIS — E66813 Obesity, class 3: Secondary | ICD-10-CM | POA: Insufficient documentation

## 2019-03-27 DIAGNOSIS — M549 Dorsalgia, unspecified: Secondary | ICD-10-CM

## 2019-03-27 DIAGNOSIS — Z6841 Body Mass Index (BMI) 40.0 and over, adult: Secondary | ICD-10-CM | POA: Insufficient documentation

## 2019-03-27 DIAGNOSIS — F419 Anxiety disorder, unspecified: Secondary | ICD-10-CM | POA: Insufficient documentation

## 2019-03-27 DIAGNOSIS — K029 Dental caries, unspecified: Secondary | ICD-10-CM | POA: Insufficient documentation

## 2019-04-11 ENCOUNTER — Ambulatory Visit: Payer: BLUE CROSS/BLUE SHIELD | Admitting: Obstetrics and Gynecology

## 2019-04-25 ENCOUNTER — Telehealth: Payer: Self-pay

## 2019-04-25 NOTE — Telephone Encounter (Signed)
Left a vm for patient to callback and do screening before appointment  

## 2019-04-26 ENCOUNTER — Ambulatory Visit: Payer: BLUE CROSS/BLUE SHIELD | Admitting: Family Medicine

## 2019-04-30 ENCOUNTER — Telehealth: Payer: Self-pay

## 2019-04-30 NOTE — Telephone Encounter (Signed)
Patient negative for screening and will be coming into office  

## 2019-04-30 NOTE — Telephone Encounter (Signed)
Left a vm for patient to callback and do screening before appointment

## 2019-05-01 ENCOUNTER — Ambulatory Visit (INDEPENDENT_AMBULATORY_CARE_PROVIDER_SITE_OTHER): Payer: BC Managed Care – PPO | Admitting: Family Medicine

## 2019-05-01 ENCOUNTER — Encounter: Payer: Self-pay | Admitting: Family Medicine

## 2019-05-01 ENCOUNTER — Other Ambulatory Visit: Payer: Self-pay

## 2019-05-01 VITALS — BP 114/70 | HR 99 | Temp 98.0°F | Ht 63.0 in | Wt 256.0 lb

## 2019-05-01 DIAGNOSIS — G629 Polyneuropathy, unspecified: Secondary | ICD-10-CM | POA: Diagnosis not present

## 2019-05-01 DIAGNOSIS — M5440 Lumbago with sciatica, unspecified side: Secondary | ICD-10-CM

## 2019-05-01 DIAGNOSIS — R232 Flushing: Secondary | ICD-10-CM | POA: Diagnosis not present

## 2019-05-01 DIAGNOSIS — Z09 Encounter for follow-up examination after completed treatment for conditions other than malignant neoplasm: Secondary | ICD-10-CM | POA: Diagnosis not present

## 2019-05-01 DIAGNOSIS — F419 Anxiety disorder, unspecified: Secondary | ICD-10-CM | POA: Diagnosis not present

## 2019-05-01 DIAGNOSIS — M5441 Lumbago with sciatica, right side: Secondary | ICD-10-CM

## 2019-05-01 DIAGNOSIS — G8929 Other chronic pain: Secondary | ICD-10-CM

## 2019-05-01 LAB — POCT URINALYSIS DIP (MANUAL ENTRY)
Bilirubin, UA: NEGATIVE
Blood, UA: NEGATIVE
Glucose, UA: NEGATIVE mg/dL
Ketones, POC UA: NEGATIVE mg/dL
Leukocytes, UA: NEGATIVE
Nitrite, UA: NEGATIVE
Protein Ur, POC: NEGATIVE mg/dL
Spec Grav, UA: 1.02 (ref 1.010–1.025)
Urobilinogen, UA: 1 E.U./dL
pH, UA: 5.5 (ref 5.0–8.0)

## 2019-05-01 MED ORDER — TRAMADOL HCL 50 MG PO TABS: 50.0000 mg | ORAL_TABLET | Freq: Three times a day (TID) | ORAL | 0 refills | Status: DC | PRN

## 2019-05-01 MED ORDER — GABAPENTIN 100 MG PO CAPS: 100.0000 mg | ORAL_CAPSULE | Freq: Two times a day (BID) | ORAL | 1 refills | Status: DC

## 2019-05-01 NOTE — Patient Instructions (Addendum)
Chronic Back Pain When back pain lasts longer than 3 months, it is called chronic back pain. Pain may get worse at certain times (flare-ups). There are things you can do at home to manage your pain. Follow these instructions at home: Activity      Avoid bending and other activities that make pain worse.  When standing: ? Keep your upper back and neck straight. ? Keep your shoulders pulled back. ? Avoid slouching.  When sitting: ? Keep your back straight. ? Relax your shoulders. Do not round your shoulders or pull them backward.  Do not sit or stand in one place for long periods of time.  Take short rest breaks during the day. Lying down or standing is usually better than sitting. Resting can help relieve pain.  When sitting or lying down for a long time, do some mild activity or stretching. This will help to prevent stiffness and pain.  Get regular exercise. Ask your doctor what activities are safe for you.  Do not lift anything that is heavier than 10 lb (4.5 kg). To prevent injury when you lift things: ? Bend your knees. ? Keep the weight close to your body. ? Avoid twisting. Managing pain  If told, put ice on the painful area. Your doctor may tell you to use ice for 24-48 hours after a flare-up starts. ? Put ice in a plastic bag. ? Place a towel between your skin and the bag. ? Leave the ice on for 20 minutes, 2-3 times a day.  If told, put heat on the painful area as often as told by your doctor. Use the heat source that your doctor recommends, such as a moist heat pack or a heating pad. ? Place a towel between your skin and the heat source. ? Leave the heat on for 20-30 minutes. ? Remove the heat if your skin turns bright red. This is especially important if you are unable to feel pain, heat, or cold. You may have a greater risk of getting burned.  Soak in a warm bath. This can help relieve pain.  Take over-the-counter and prescription medicines only as told by your  doctor. General instructions  Sleep on a firm mattress. Try lying on your side with your knees slightly bent. If you lie on your back, put a pillow under your knees.  Keep all follow-up visits as told by your doctor. This is important. Contact a doctor if:  You have pain that does not get better with rest or medicine. Get help right away if:  One or both of your arms or legs feel weak.  One or both of your arms or legs lose feeling (numbness).  You have trouble controlling when you poop (bowel movement) or pee (urinate).  You feel sick to your stomach (nauseous).  You throw up (vomit).  You have belly (abdominal) pain.  You have shortness of breath.  You pass out (faint). Summary  When back pain lasts longer than 3 months, it is called chronic back pain.  Pain may get worse at certain times (flare-ups).  Use ice and heat as told by your doctor. Your doctor may tell you to use ice after flare-ups. This information is not intended to replace advice given to you by your health care provider. Make sure you discuss any questions you have with your health care provider. Document Released: 04/25/2008 Document Revised: 06/22/2017 Document Reviewed: 06/22/2017 Else[employer] Interactive Patient Education  2019 [employer] Neuropathic Pain Neuropathic pain is pain caused  by damage to the nerves that are responsible for certain sensations in your body (sensory nerves). The pain can be caused by:  Damage to the sensory nerves that send signals to your spinal cord and brain (peripheral nervous system).  Damage to the sensory nerves in your brain or spinal cord (central nervous system). Neuropathic pain can make you more sensitive to pain. Even a minor sensation can feel very painful. This is usually a long-term condition that can be difficult to treat. The type of pain differs from person to person. It may:  Start suddenly (acute), or it may develop slowly and last for a long time  (chronic).  Come and go as damaged nerves heal, or it may stay at the same level for years.  Cause emotional distress, loss of sleep, and a lower quality of life. What are the causes? The most common cause of this condition is diabetes. Many other diseases and conditions can also cause neuropathic pain. Causes of neuropathic pain can be classified as:  Toxic. This is caused by medicines and chemicals. The most common cause of toxic neuropathic pain is damage from cancer treatments (chemotherapy).  Metabolic. This can be caused by: ? Diabetes. This is the most common disease that damages the nerves. ? Lack of vitamin B from long-term alcohol abuse.  Traumatic. Any injury that cuts, crushes, or stretches a nerve can cause damage and pain. A common example is feeling pain after losing an arm or leg (phantom limb pain).  Compression-related. If a sensory nerve gets trapped or compressed for a long period of time, the blood supply to the nerve can be cut off.  Vascular. Many blood vessel diseases can cause neuropathic pain by decreasing blood supply and oxygen to nerves.  Autoimmune. This type of pain results from diseases in which the body's defense system (immune system) mistakenly attacks sensory nerves. Examples of autoimmune diseases that can cause neuropathic pain include lupus and multiple sclerosis.  Infectious. Many types of viral infections can damage sensory nerves and cause pain. Shingles infection is a common cause of this type of pain.  Inherited. Neuropathic pain can be a symptom of many diseases that are passed down through families (genetic). What increases the risk? You are more likely to develop this condition if:  You have diabetes.  You smoke.  You drink too much alcohol.  You are taking certain medicines, including medicines that kill cancer cells (chemotherapy) or that treat immune system disorders. What are the signs or symptoms? The main symptom is pain.  Neuropathic pain is often described as:  Burning.  Shock-like.  Stinging.  Hot or cold.  Itching. How is this diagnosed? No single test can diagnose neuropathic pain. It is diagnosed based on:  Physical exam and your symptoms. Your health care provider will ask you about your pain. You may be asked to use a pain scale to describe how bad your pain is.  Tests. These may be done to see if you have a high sensitivity to pain and to help find the cause and location of any sensory nerve damage. They include: ? Nerve conduction studies to test how well nerve signals travel through your sensory nerves (electrodiagnostic testing). ? Stimulating your sensory nerves through electrodes on your skin and measuring the response in your spinal cord and brain (somatosensory evoked potential).  Imaging studies, such as: ? X-rays. ? CT scan. ? MRI. How is this treated? Treatment for neuropathic pain may change over time. You may need to  try different treatment options or a combination of treatments. Some options include:  Treating the underlying cause of the neuropathy, such as diabetes, kidney disease, or vitamin deficiencies.  Stopping medicines that can cause neuropathy, such as chemotherapy.  Medicine to relieve pain. Medicines may include: ? Prescription or over-the-counter pain medicine. ? Anti-seizure medicine. ? Antidepressant medicines. ? Pain-relieving patches that are applied to painful areas of skin. ? A medicine to numb the area (local anesthetic), which can be injected as a nerve block.  Transcutaneous nerve stimulation. This uses electrical currents to block painful nerve signals. The treatment is painless.  Alternative treatments, such as: ? Acupuncture. ? Meditation. ? Massage. ? Physical therapy. ? Pain management programs. ? Counseling. Follow these instructions at home: Medicines   Take over-the-counter and prescription medicines only as told by your health care  provider.  Do not drive or use heavy machinery while taking prescription pain medicine.  If you are taking prescription pain medicine, take actions to prevent or treat constipation. Your health care provider may recommend that you: ? Drink enough fluid to keep your urine pale yellow. ? Eat foods that are high in fiber, such as fresh fruits and vegetables, whole grains, and beans. ? Limit foods that are high in fat and processed sugars, such as fried or sweet foods. ? Take an over-the-counter or prescription medicine for constipation. Lifestyle   Have a good support system at home.  Consider joining a chronic pain support group.  Do not use any products that contain nicotine or tobacco, such as cigarettes and e-cigarettes. If you need help quitting, ask your health care provider.  Do not drink alcohol. General instructions  Learn as much as you can about your condition.  Work closely with all your health care providers to find the treatment plan that works best for you.  Ask your health care provider what activities are safe for you.  Keep all follow-up visits as told by your health care provider. This is important. Contact a health care provider if:  Your pain treatments are not working.  You are having side effects from your medicines.  You are struggling with tiredness (fatigue), mood changes, depression, or anxiety. Summary  Neuropathic pain is pain caused by damage to the nerves that are responsible for certain sensations in your body (sensory nerves).  Neuropathic pain may come and go as damaged nerves heal, or it may stay at the same level for years.  Neuropathic pain is usually a long-term condition that can be difficult to treat. Consider joining a chronic pain support group. This information is not intended to replace advice given to you by your health care provider. Make sure you discuss any questions you have with your health care provider. Document Released:  08/04/2004 Document Revised: 11/24/2017 Document Reviewed: 11/24/2017 Elsevier Interactive Patient Education  2019 [employer]     Tramadol tablets What is this medicine? TRAMADOL (TRA ma dole) is a pain reliever. It is used to treat moderate to severe pain in adults. This medicine may be used for other purposes; ask your health care provider or pharmacist if you have questions. COMMON BRAND NAME(S): Ultram What should I tell my health care provider before I take this medicine? They need to know if you have any of these conditions: -brain tumor -depression -drug abuse or addiction -head injury -if you frequently drink alcohol containing drinks -kidney disease or trouble passing urine -liver disease -lung disease, asthma, or breathing problems -seizures or epilepsy -suicidal thoughts, plans,  or attempt; a previous suicide attempt by you or a family member -an unusual or allergic reaction to tramadol, codeine, other medicines, foods, dyes, or preservatives -pregnant or trying to get pregnant -breast-feeding How should I use this medicine? Take this medicine by mouth with a full glass of water. Follow the directions on the prescription label. You can take it with or without food. If it upsets your stomach, take it with food. Do not take your medicine more often than directed. A special MedGuide will be given to you by the pharmacist with each prescription and refill. Be sure to read this information carefully each time. Talk to your pediatrician regarding the use of this medicine in children. Special care may be needed. Overdosage: If you think you have taken too much of this medicine contact a poison control center or emergency room at once. NOTE: This medicine is only for you. Do not share this medicine with others. What if I miss a dose? If you miss a dose, take it as soon as you can. If it is almost time for your next dose, take only that dose. Do not take double or extra doses.  What may interact with this medicine? Do not take this medication with any of the following medicines: -MAOIs like Carbex, Eldepryl, Marplan, Nardil, and Parnate This medicine may also interact with the following medications: -alcohol -antihistamines for allergy, cough and cold -certain medicines for anxiety or sleep -certain medicines for depression like amitriptyline, fluoxetine, sertraline -certain medicines for migraine headache like almotriptan, eletriptan, frovatriptan, naratriptan, rizatriptan, sumatriptan, zolmitriptan -certain medicines for seizures like carbamazepine, oxcarbazepine, phenobarbital, primidone -certain medicines that treat or prevent blood clots like warfarin -digoxin -furazolidone -general anesthetics like halothane, isoflurane, methoxyflurane, propofol -linezolid -local anesthetics like lidocaine, pramoxine, tetracaine -medicines that relax muscles for surgery -other narcotic medicines for pain or cough -phenothiazines like chlorpromazine, mesoridazine, prochlorperazine, thioridazine -procarbazine This list may not describe all possible interactions. Give your health care provider a list of all the medicines, herbs, non-prescription drugs, or dietary supplements you use. Also tell them if you smoke, drink alcohol, or use illegal drugs. Some items may interact with your medicine. What should I watch for while using this medicine? Tell your doctor or health care professional if your pain does not go away, if it gets worse, or if you have new or a different type of pain. You may develop tolerance to the medicine. Tolerance means that you will need a higher dose of the medicine for pain relief. Tolerance is normal and is expected if you take this medicine for a long time. Do not suddenly stop taking your medicine because you may develop a severe reaction. Your body becomes used to the medicine. This does NOT mean you are addicted. Addiction is a behavior related to  getting and using a drug for a non-medical reason. If you have pain, you have a medical reason to take pain medicine. Your doctor will tell you how much medicine to take. If your doctor wants you to stop the medicine, the dose will be slowly lowered over time to avoid any side effects. There are different types of narcotic medicines (opiates). If you take more than one type at the same time or if you are taking another medicine that also causes drowsiness, you may have more side effects. Give your health care provider a list of all medicines you use. Your doctor will tell you how much medicine to take. Do not take more medicine than directed. Call emergency  for help if you have problems breathing or unusual sleepiness. You may get drowsy or dizzy. Do not drive, use machinery, or do anything that needs mental alertness until you know how this medicine affects you. Do not stand or sit up quickly, especially if you are an older patient. This reduces the risk of dizzy or fainting spells. Alcohol can increase or decrease the effects of this medicine. Avoid alcoholic drinks. You may have constipation. Try to have a bowel movement at least every 2 to 3 days. If you do not have a bowel movement for 3 days, call your doctor or health care professional. Your mouth may get dry. Chewing sugarless gum or sucking hard candy, and drinking plenty of water may help. Contact your doctor if the problem does not go away or is severe. What side effects may I notice from receiving this medicine? Side effects that you should report to your doctor or health care professional as soon as possible: -allergic reactions like skin rash, itching or hives, swelling of the face, lips, or tongue -breathing problems -confusion -seizures -signs and symptoms of low blood pressure like dizziness; feeling faint or lightheaded, falls; unusually weak or tired -trouble passing urine or change in the amount of urine Side effects that usually do  not require medical attention (report to your doctor or health care professional if they continue or are bothersome): -constipation -dry mouth -nausea, vomiting -tiredness This list may not describe all possible side effects. Call your doctor for medical advice about side effects. You may report side effects to FDA at 1-800-FDA-1088. Where should I keep my medicine? Keep out of the reach of children. This medicine may cause accidental overdose and death if it taken by other adults, children, or pets. Mix any unused medicine with a substance like cat litter or coffee grounds. Then throw the medicine away in a sealed container like a sealed bag or a coffee can with a lid. Do not use the medicine after the expiration date. Store at room temperature between 15 and 30 degrees C (59 and 86 degrees F). NOTE: This sheet is a summary. It may not cover all possible information. If you have questions about this medicine, talk to your doctor, pharmacist, or health care provider.  2019 [employer]vier/[employer] Standard (2015-08-02 09:00:04)

## 2019-05-01 NOTE — Progress Notes (Signed)
Patient [name]   Established Patient Office Visit  Subjective:  Patient ID: Destiny Cook, female    DOB: Nov 09, 1986  Age: 33 y.o. MRN: [id number]  CC:  Chief Complaint  Patient presents with  . Follow-up    hot flashes, back pain, leg pain     HPI Destiny Cook is a 33 year old female for Follow Up today.   Past Medical History:  Diagnosis Date  . Anxiety   . Chronic back pain   . Chronic left hip pain   . Dental caries   . Hypertension    Current Status: Since her last office visit, she is doing well with no complaints. She denies visual changes, chest pain, cough, shortness of breath, heart palpitations, and falls. She has occasional headaches and dizziness with position changes. Denies severe headaches, confusion, seizures, double vision, and blurred vision, nausea and vomiting. Her anxiety is moderate today, as r/t family and personal issues. She denies suicidal ideations, homicidal ideations, or auditory hallucinations.  She denies fevers, chills, fatigue, recent infections, weight loss, and night sweats. No reports of GI problems such as diarrhea, and constipation. She has no reports of blood in stools, dysuria and hematuria. She denies pain today.    Past Surgical History:  Procedure Laterality Date  . CESAREAN SECTION     x 4  . TUBAL LIGATION      Family History  Problem Relation Age of Onset  . Hypertension Mother     Social History   Socioeconomic History  . Marital status: Single    Spouse name: Not on file  . Number of children: Not on file  . Years of education: Not on file  . Highest education level: Not on file  Occupational History  . Not on file  Social Needs  . Financial resource strain: Not on file  . Food insecurity    Worry: Not on file    Inability: Not on file  . Transportation needs    Medical: Not on file    Non-medical: Not on file  Tobacco Use  . Smoking status: Current Every  Day Smoker    Packs/day: 0.50    Years: 15.00    Pack years: 7.50    Types: Cigarettes  . Smokeless tobacco: Never Used  . Tobacco comment: started age 16  Substance and Sexual Activity  . Alcohol use: No  . Drug use: No  . Sexual activity: Yes    Partners: Male    Birth control/protection: Surgical  Lifestyle  . Physical activity    Days per week: Not on file    Minutes per session: Not on file  . Stress: Not on file  Relationships  . Social Herbalist on phone: Not on file    Gets together: Not on file    Attends religious service: Not on file    Active member of club or organization: Not on file    Attends meetings of clubs or organizations: Not on file    Relationship status: Not on file  . Intimate partner violence    Fear of current or ex partner: Not on file    Emotionally abused: Not on file    Physically abused: Not on file    Forced sexual activity: Not on file  Other Topics Concern  . Not on file  Social History Narrative  . Not on file    Outpatient Medications Prior to Visit  Medication  Sig Dispense Refill  . cyclobenzaprine (FLEXERIL) 10 MG tablet Take 1 tablet (10 mg total) by mouth 3 (three) times daily as needed for muscle spasms. 30 tablet 1  . hydrochlorothiazide (HYDRODIURIL) 25 MG tablet Take 1 tablet (25 mg total) by mouth daily. 30 tablet 1  . ibuprofen (ADVIL) 800 MG tablet Take 1 tablet (800 mg total) by mouth every 8 (eight) hours as needed. 30 tablet 1  . metFORMIN (GLUCOPHAGE) 500 MG tablet Take 1 tablet (500 mg total) by mouth 2 (two) times daily with a meal. 60 tablet 1   No facility-administered medications prior to visit.     Allergies  Allergen Reactions  . Penicillins Itching and Rash    ROS Review of Systems  Constitutional: Negative.   HENT: Negative.   Eyes: Negative.   Respiratory: Negative.   Cardiovascular: Negative.   Gastrointestinal: Negative.   Endocrine: Negative.   Genitourinary: Negative.    Musculoskeletal: Negative.   Skin: Negative.   Allergic/Immunologic: Negative.   Neurological: Positive for headaches (occasional).  Hematological: Negative.   Psychiatric/Behavioral: Negative.       Objective:    Physical Exam  Constitutional: She is oriented to person, place, and time. She appears well-developed and well-nourished.  HENT:  Head: Normocephalic and atraumatic.  Eyes: Conjunctivae are normal.  Neck: Normal range of motion. Neck supple.  Cardiovascular: Normal rate, regular rhythm, normal heart sounds and intact distal pulses.  Pulmonary/Chest: Effort normal and breath sounds normal.  Abdominal: Soft. Bowel sounds are normal.  Musculoskeletal: Normal range of motion.  Neurological: She is alert and oriented to person, place, and time. She has normal reflexes.  Skin: Skin is warm and dry.  Psychiatric: She has a normal mood and affect. Her behavior is normal. Judgment and thought content normal.  Nursing note and vitals reviewed.   BP 114/70 (BP Location: Left Arm, Patient Position: Sitting, Cuff Size: Large)   Pulse 99   Temp 98 F (36.7 C) (Oral)   Ht 5\' 3"  (1.6 m)   Wt 256 lb (116.1 kg)   LMP 04/26/2019   SpO2 100%   BMI 45.35 kg/m  Wt Readings from Last 3 Encounters:  05/01/19 256 lb (116.1 kg)  03/26/19 262 lb (118.8 kg)  03/26/19 250 lb (113.4 kg)     Health Maintenance Due  Topic Date Due  . PAP SMEAR-Modifier  07/01/2007    There are no preventive care reminders to display for this patient.  No results found for: TSH Lab Results  Component Value Date   WBC 8.5 03/26/2019   HGB 12.3 03/26/2019   HCT 37.5 03/26/2019   MCV 84.1 03/26/2019   PLT 274 03/26/2019   Lab Results  Component Value Date   NA 138 03/26/2019   K 3.6 03/26/2019   CO2 22 03/26/2019   GLUCOSE 108 (H) 03/26/2019   BUN 14 03/26/2019   CREATININE 0.94 03/26/2019   BILITOT 0.2 (L) 03/12/2019   ALKPHOS 65 03/12/2019   AST 22 03/12/2019   ALT 26 03/12/2019    PROT 6.7 03/12/2019   ALBUMIN 3.7 03/12/2019   CALCIUM 8.6 (L) 03/26/2019   ANIONGAP 8 03/26/2019   No results found for: CHOL No results found for: HDL No results found for: LDLCALC No results found for: TRIG No results found for: CHOLHDL Lab Results  Component Value Date   HGBA1C 5.9 (A) 03/26/2019      Assessment & Plan:   1. Chronic bilateral low back pain with sciatica, sciatica laterality  unspecified We will initiate Tramadol today.  - traMADol (ULTRAM) 50 MG tablet; Take 1 tablet (50 mg total) by mouth every 8 (eight) hours as needed for up to 30 days for severe pain.  Dispense: 30 tablet; Refill: 0  2. Neuropathy - gabapentin (NEURONTIN) 100 MG capsule; Take 1 capsule (100 mg total) by mouth 2 (two) times daily.  Dispense: 60 capsule; Refill: 1  3. Hot flashes We will initiate Gabapentin today.  - gabapentin (NEURONTIN) 100 MG capsule; Take 1 capsule (100 mg total) by mouth 2 (two) times daily.  Dispense: 60 capsule; Refill: 1  4. Anxiety  5. Follow up She will follow up in 1 month. - POCT urinalysis dipstick  Meds ordered this encounter  Medications  . gabapentin (NEURONTIN) 100 MG capsule    Sig: Take 1 capsule (100 mg total) by mouth 2 (two) times daily.    Dispense:  60 capsule    Refill:  1  . DISCONTD: traMADol (ULTRAM) 50 MG tablet    Sig: Take 1 tablet (50 mg total) by mouth every 8 (eight) hours as needed for up to 5 days.    Dispense:  15 tablet    Refill:  0  . traMADol (ULTRAM) 50 MG tablet    Sig: Take 1 tablet (50 mg total) by mouth every 8 (eight) hours as needed for up to 30 days for severe pain.    Dispense:  30 tablet    Refill:  0    Order Specific Question:   Supervising Provider    Answer:   Quentin AngstJEGEDE, OLUGBEMIGA E [id number]]    Orders Placed This Encounter  Procedures  . POCT urinalysis dipstick    Referral Orders  No referral(s) requested today    Raliegh IpNatalie Naftula Donahue,  MSN, FNP-BC [facility] [address]  [place], [place] [id number] [phone]   Problem List Items Addressed This Visit      Other   Anxiety   Chronic back pain - Primary   Relevant Medications   gabapentin (NEURONTIN) 100 MG capsule   traMADol (ULTRAM) 50 MG tablet    Other Visit Diagnoses    Neuropathy       Relevant Medications   gabapentin (NEURONTIN) 100 MG capsule   Hot flashes       Relevant Medications   gabapentin (NEURONTIN) 100 MG capsule   Follow up       Relevant Orders   POCT urinalysis dipstick (Completed)      Meds ordered this encounter  Medications  . gabapentin (NEURONTIN) 100 MG capsule    Sig: Take 1 capsule (100 mg total) by mouth 2 (two) times daily.    Dispense:  60 capsule    Refill:  1  . DISCONTD: traMADol (ULTRAM) 50 MG tablet    Sig: Take 1 tablet (50 mg total) by mouth every 8 (eight) hours as needed for up to 5 days.    Dispense:  15 tablet    Refill:  0  . traMADol (ULTRAM) 50 MG tablet    Sig: Take 1 tablet (50 mg total) by mouth every 8 (eight) hours as needed for up to 30 days for severe pain.    Dispense:  30 tablet    Refill:  0    Order Specific Question:   Supervising Provider    Answer:   Quentin AngstJEGEDE, OLUGBEMIGA E [[id number]]    Follow-up: Return in about 1 month (around 05/31/2019).    Kallie LocksNatalie M Izreal Kock,  FNP 

## 2019-05-02 ENCOUNTER — Telehealth: Payer: Self-pay

## 2019-05-02 ENCOUNTER — Other Ambulatory Visit: Payer: Self-pay | Admitting: Family Medicine

## 2019-05-02 DIAGNOSIS — R232 Flushing: Secondary | ICD-10-CM

## 2019-05-02 DIAGNOSIS — M5440 Lumbago with sciatica, unspecified side: Secondary | ICD-10-CM

## 2019-05-02 DIAGNOSIS — G8929 Other chronic pain: Secondary | ICD-10-CM

## 2019-05-02 DIAGNOSIS — M5441 Lumbago with sciatica, right side: Secondary | ICD-10-CM

## 2019-05-02 DIAGNOSIS — G629 Polyneuropathy, unspecified: Secondary | ICD-10-CM | POA: Insufficient documentation

## 2019-05-02 MED ORDER — TRAMADOL HCL 50 MG PO TABS: 50.0000 mg | ORAL_TABLET | Freq: Three times a day (TID) | ORAL | 0 refills | Status: DC | PRN

## 2019-05-02 MED ORDER — GABAPENTIN 100 MG PO CAPS: 100.0000 mg | ORAL_CAPSULE | Freq: Two times a day (BID) | ORAL | 1 refills | Status: DC

## 2019-05-02 NOTE — Telephone Encounter (Signed)
Patient states that Gabapentin and Tramadol were suppose to be sent to [facility] instead of [facility]. I did call [facility] and cancel both scripts.

## 2019-05-03 ENCOUNTER — Telehealth: Payer: Self-pay

## 2019-05-03 NOTE — Telephone Encounter (Signed)
Duplicate

## 2019-05-03 NOTE — Telephone Encounter (Signed)
Patient notified

## 2019-05-16 ENCOUNTER — Ambulatory Visit: Payer: BC Managed Care – PPO | Admitting: Obstetrics and Gynecology

## 2019-05-31 ENCOUNTER — Ambulatory Visit: Payer: BC Managed Care – PPO | Admitting: Family Medicine

## 2019-07-02 ENCOUNTER — Ambulatory Visit: Payer: BC Managed Care – PPO | Admitting: Family Medicine

## 2019-09-02 ENCOUNTER — Ambulatory Visit: Payer: BC Managed Care – PPO | Admitting: Family Medicine

## 2019-09-12 ENCOUNTER — Other Ambulatory Visit: Payer: Self-pay

## 2019-09-12 DIAGNOSIS — Z20822 Contact with and (suspected) exposure to covid-19: Secondary | ICD-10-CM

## 2019-09-14 LAB — NOVEL CORONAVIRUS, NAA: SARS-CoV-2, NAA: NOT DETECTED

## 2019-09-17 ENCOUNTER — Telehealth: Payer: Self-pay | Admitting: Family Medicine

## 2019-09-17 NOTE — Telephone Encounter (Signed)
° °  Pt rec neg COVID results °

## 2019-09-22 DIAGNOSIS — G4700 Insomnia, unspecified: Secondary | ICD-10-CM

## 2019-09-22 HISTORY — DX: Insomnia, unspecified: G47.00

## 2019-10-16 ENCOUNTER — Encounter: Payer: Self-pay | Admitting: Family Medicine

## 2019-10-16 ENCOUNTER — Other Ambulatory Visit: Payer: Self-pay

## 2019-10-16 ENCOUNTER — Ambulatory Visit (INDEPENDENT_AMBULATORY_CARE_PROVIDER_SITE_OTHER): Payer: BC Managed Care – PPO | Admitting: Family Medicine

## 2019-10-16 VITALS — BP 123/80 | HR 97 | Temp 98.0°F | Ht 63.0 in | Wt 261.6 lb

## 2019-10-16 DIAGNOSIS — R7303 Prediabetes: Secondary | ICD-10-CM | POA: Diagnosis not present

## 2019-10-16 DIAGNOSIS — F4321 Adjustment disorder with depressed mood: Secondary | ICD-10-CM | POA: Diagnosis not present

## 2019-10-16 DIAGNOSIS — G4700 Insomnia, unspecified: Secondary | ICD-10-CM

## 2019-10-16 DIAGNOSIS — M549 Dorsalgia, unspecified: Secondary | ICD-10-CM

## 2019-10-16 DIAGNOSIS — Z23 Encounter for immunization: Secondary | ICD-10-CM | POA: Diagnosis not present

## 2019-10-16 DIAGNOSIS — I10 Essential (primary) hypertension: Secondary | ICD-10-CM

## 2019-10-16 DIAGNOSIS — Z09 Encounter for follow-up examination after completed treatment for conditions other than malignant neoplasm: Secondary | ICD-10-CM

## 2019-10-16 DIAGNOSIS — E66813 Obesity, class 3: Secondary | ICD-10-CM

## 2019-10-16 DIAGNOSIS — Z6841 Body Mass Index (BMI) 40.0 and over, adult: Secondary | ICD-10-CM

## 2019-10-16 DIAGNOSIS — M5441 Lumbago with sciatica, right side: Secondary | ICD-10-CM

## 2019-10-16 DIAGNOSIS — G8929 Other chronic pain: Secondary | ICD-10-CM

## 2019-10-16 DIAGNOSIS — M5440 Lumbago with sciatica, unspecified side: Secondary | ICD-10-CM

## 2019-10-16 DIAGNOSIS — R232 Flushing: Secondary | ICD-10-CM

## 2019-10-16 DIAGNOSIS — G629 Polyneuropathy, unspecified: Secondary | ICD-10-CM

## 2019-10-16 LAB — POCT URINALYSIS DIPSTICK
Bilirubin, UA: NEGATIVE
Blood, UA: NEGATIVE
Glucose, UA: NEGATIVE
Ketones, UA: NEGATIVE
Leukocytes, UA: NEGATIVE
Nitrite, UA: NEGATIVE
Protein, UA: NEGATIVE
Spec Grav, UA: 1.025 (ref 1.010–1.025)
Urobilinogen, UA: 0.2 E.U./dL
pH, UA: 6 (ref 5.0–8.0)

## 2019-10-16 LAB — POCT GLYCOSYLATED HEMOGLOBIN (HGB A1C): Hemoglobin A1C: 5.3 % (ref 4.0–5.6)

## 2019-10-16 LAB — GLUCOSE, POCT (MANUAL RESULT ENTRY): POC Glucose: 115 mg/dl — AB (ref 70–99)

## 2019-10-16 MED ORDER — ALPRAZOLAM 0.5 MG PO TABS: 0.5000 mg | ORAL_TABLET | Freq: Every evening | ORAL | 0 refills | Status: DC | PRN

## 2019-10-16 MED ORDER — TRAZODONE HCL 100 MG PO TABS: 100.0000 mg | ORAL_TABLET | Freq: Every day | ORAL | 3 refills | Status: DC

## 2019-10-16 MED ORDER — IBUPROFEN 800 MG PO TABS: 800.0000 mg | ORAL_TABLET | Freq: Three times a day (TID) | ORAL | 3 refills | Status: DC | PRN

## 2019-10-16 MED ORDER — GABAPENTIN 100 MG PO CAPS: 100.0000 mg | ORAL_CAPSULE | Freq: Two times a day (BID) | ORAL | 3 refills | Status: DC

## 2019-10-16 MED ORDER — HYDROCHLOROTHIAZIDE 25 MG PO TABS: 25.0000 mg | ORAL_TABLET | Freq: Every day | ORAL | 3 refills | Status: DC

## 2019-10-16 MED ORDER — CYCLOBENZAPRINE HCL 10 MG PO TABS: 10.0000 mg | ORAL_TABLET | Freq: Three times a day (TID) | ORAL | 3 refills | Status: DC | PRN

## 2019-10-16 MED ORDER — METFORMIN HCL 500 MG PO TABS: 500.0000 mg | ORAL_TABLET | Freq: Two times a day (BID) | ORAL | 3 refills | Status: DC

## 2019-10-16 NOTE — Patient Instructions (Addendum)
Insomnia Insomnia is a sleep disorder that makes it difficult to fall asleep or stay asleep. Insomnia can cause fatigue, low energy, difficulty concentrating, mood swings, and poor performance at work or school. There are three different ways to classify insomnia:  Difficulty falling asleep.  Difficulty staying asleep.  Waking up too early in the morning. Any type of insomnia can be long-term (chronic) or short-term (acute). Both are common. Short-term insomnia usually lasts for three months or less. Chronic insomnia occurs at least three times a week for longer than three months. What are the causes? Insomnia may be caused by another condition, situation, or substance, such as:  Anxiety.  Certain medicines.  Gastroesophageal reflux disease (GERD) or other gastrointestinal conditions.  Asthma or other breathing conditions.  Restless legs syndrome, sleep apnea, or other sleep disorders.  Chronic pain.  Menopause.  Stroke.  Abuse of alcohol, tobacco, or illegal drugs.  Mental health conditions, such as depression.  Caffeine.  Neurological disorders, such as Alzheimer's disease.  An overactive thyroid (hyperthyroidism). Sometimes, the cause of insomnia may not be known. What increases the risk? Risk factors for insomnia include:  Gender. Women are affected more often than men.  Age. Insomnia is more common as you get older.  Stress.  Lack of exercise.  Irregular work schedule or working night shifts.  Traveling between different time zones.  Certain medical and mental health conditions. What are the signs or symptoms? If you have insomnia, the main symptom is having trouble falling asleep or having trouble staying asleep. This may lead to other symptoms, such as:  Feeling fatigued or having low energy.  Feeling nervous about going to sleep.  Not feeling rested in the morning.  Having trouble concentrating.  Feeling irritable, anxious, or depressed. How  is this diagnosed? This condition may be diagnosed based on:  Your symptoms and medical history. Your health care provider may ask about: ? Your sleep habits. ? Any medical conditions you have. ? Your mental health.  A physical exam. How is this treated? Treatment for insomnia depends on the cause. Treatment may focus on treating an underlying condition that is causing insomnia. Treatment may also include:  Medicines to help you sleep.  Counseling or therapy.  Lifestyle adjustments to help you sleep better. Follow these instructions at home: Eating and drinking   Limit or avoid alcohol, caffeinated beverages, and cigarettes, especially close to bedtime. These can disrupt your sleep.  Do not eat a large meal or eat spicy foods right before bedtime. This can lead to digestive discomfort that can make it hard for you to sleep. Sleep habits   Keep a sleep diary to help you and your health care provider figure out what could be causing your insomnia. Write down: ? When you sleep. ? When you wake up during the night. ? How well you sleep. ? How rested you feel the next day. ? Any side effects of medicines you are taking. ? What you eat and drink.  Make your bedroom a dark, comfortable place where it is easy to fall asleep. ? Put up shades or blackout curtains to block light from outside. ? Use a white noise machine to block noise. ? Keep the temperature cool.  Limit screen use before bedtime. This includes: ? Watching TV. ? Using your smartphone, tablet, or computer.  Stick to a routine that includes going to bed and waking up at the same times every day and night. This can help you fall asleep faster. Consider   making a quiet activity, such as reading, part of your nighttime routine.  Try to avoid taking naps during the day so that you sleep better at night.  Get out of bed if you are still awake after 15 minutes of trying to sleep. Keep the lights down, but try reading or  doing a quiet activity. When you feel sleepy, go back to bed. General instructions  Take over-the-counter and prescription medicines only as told by your health care provider.  Exercise regularly, as told by your health care provider. Avoid exercise starting several hours before bedtime.  Use relaxation techniques to manage stress. Ask your health care provider to suggest some techniques that may work well for you. These may include: ? Breathing exercises. ? Routines to release muscle tension. ? Visualizing peaceful scenes.  Make sure that you drive carefully. Avoid driving if you feel very sleepy.  Keep all follow-up visits as told by your health care provider. This is important. Contact a health care provider if:  You are tired throughout the day.  You have trouble in your daily routine due to sleepiness.  You continue to have sleep problems, or your sleep problems get worse. Get help right away if:  You have serious thoughts about hurting yourself or someone else. If you ever feel like you may hurt yourself or others, or have thoughts about taking your own life, get help right away. You can go to your nearest emergency department or call:  Your local emergency services (911 in the [place]  A suicide crisis helpline, such as the National Suicide Prevention Lifeline at 1-800-273-8255. This is open 24 hours a day. Summary  Insomnia is a sleep disorder that makes it difficult to fall asleep or stay asleep.  Insomnia can be long-term (chronic) or short-term (acute).  Treatment for insomnia depends on the cause. Treatment may focus on treating an underlying condition that is causing insomnia.  Keep a sleep diary to help you and your health care provider figure out what could be causing your insomnia. This information is not intended to replace advice given to you by your health care provider. Make sure you discuss any questions you have with your health care provider. Document  Released: 11/04/2000 Document Revised: 10/20/2017 Document Reviewed: 08/17/2017 Elsevier Patient Education  2020 [employer] Trazodone tablets What is this medicine? TRAZODONE (TRAZ oh done) is used to treat depression. This medicine may be used for other purposes; ask your health care provider or pharmacist if you have questions. COMMON BRAND NAME(S): Desyrel What should I tell my health care provider before I take this medicine? They need to know if you have any of these conditions:  attempted suicide or thinking about it  bipolar disorder  bleeding problems  glaucoma  heart disease, or previous heart attack  irregular heart beat  kidney or liver disease  low levels of sodium in the blood  an unusual or allergic reaction to trazodone, other medicines, foods, dyes or preservatives  pregnant or trying to get pregnant  breast-feeding How should I use this medicine? Take this medicine by mouth with a glass of water. Follow the directions on the prescription label. Take this medicine shortly after a meal or a light snack. Take your medicine at regular intervals. Do not take your medicine more often than directed. Do not stop taking this medicine suddenly except upon the advice of your doctor. Stopping this medicine too quickly may cause serious side effects or your condition may worsen. A special MedGuide   will be given to you by the pharmacist with each prescription and refill. Be sure to read this information carefully each time. Talk to your pediatrician regarding the use of this medicine in children. Special care may be needed. Overdosage: If you think you have taken too much of this medicine contact a poison control center or emergency room at once. NOTE: This medicine is only for you. Do not share this medicine with others. What if I miss a dose? If you miss a dose, take it as soon as you can. If it is almost time for your next dose, take only that dose. Do not take double  or extra doses. What may interact with this medicine? Do not take this medicine with any of the following medications:  certain medicines for fungal infections like fluconazole, itraconazole, ketoconazole, posaconazole, voriconazole  cisapride  dronedarone  linezolid  MAOIs like Carbex, Eldepryl, Marplan, Nardil, and Parnate  mesoridazine  methylene blue (injected into a vein)  pimozide  saquinavir  thioridazine This medicine may also interact with the following medications:  alcohol  antiviral medicines for HIV or AIDS  aspirin and aspirin-like medicines  barbiturates like phenobarbital  certain medicines for blood pressure, heart disease, irregular heart beat  certain medicines for depression, anxiety, or psychotic disturbances  certain medicines for migraine headache like almotriptan, eletriptan, frovatriptan, naratriptan, rizatriptan, sumatriptan, zolmitriptan  certain medicines for seizures like carbamazepine and phenytoin  certain medicines for sleep  certain medicines that treat or prevent blood clots like dalteparin, enoxaparin, warfarin  digoxin  fentanyl  lithium  NSAIDS, medicines for pain and inflammation, like ibuprofen or naproxen  other medicines that prolong the QT interval (cause an abnormal heart rhythm) like dofetilide  rasagiline  supplements like St. John's wort, kava kava, valerian  tramadol  tryptophan This list may not describe all possible interactions. Give your health care provider a list of all the medicines, herbs, non-prescription drugs, or dietary supplements you use. Also tell them if you smoke, drink alcohol, or use illegal drugs. Some items may interact with your medicine. What should I watch for while using this medicine? Tell your doctor if your symptoms do not get better or if they get worse. Visit your doctor or health care professional for regular checks on your progress. Because it may take several weeks to see  the full effects of this medicine, it is important to continue your treatment as prescribed by your doctor. Patients and their families should watch out for new or worsening thoughts of suicide or depression. Also watch out for sudden changes in feelings such as feeling anxious, agitated, panicky, irritable, hostile, aggressive, impulsive, severely restless, overly excited and hyperactive, or not being able to sleep. If this happens, especially at the beginning of treatment or after a change in dose, call your health care professional. You may get drowsy or dizzy. Do not drive, use machinery, or do anything that needs mental alertness until you know how this medicine affects you. Do not stand or sit up quickly, especially if you are an older patient. This reduces the risk of dizzy or fainting spells. Alcohol may interfere with the effect of this medicine. Avoid alcoholic drinks. This medicine may cause dry eyes and blurred vision. If you wear contact lenses you may feel some discomfort. Lubricating drops may help. See your eye doctor if the problem does not go away or is severe. Your mouth may get dry. Chewing sugarless gum, sucking hard candy and drinking plenty of water may help.   Contact your doctor if the problem does not go away or is severe. What side effects may I notice from receiving this medicine? Side effects that you should report to your doctor or health care professional as soon as possible:  allergic reactions like skin rash, itching or hives, swelling of the face, lips, or tongue  elevated mood, decreased need for sleep, racing thoughts, impulsive behavior  confusion  fast, irregular heartbeat  feeling faint or lightheaded, falls  feeling agitated, angry, or irritable  loss of balance or coordination  painful or prolonged erections  restlessness, pacing, inability to keep still  suicidal thoughts or other mood changes  tremors  trouble sleeping  seizures  unusual  bleeding or bruising Side effects that usually do not require medical attention (report to your doctor or health care professional if they continue or are bothersome):  change in sex drive or performance  change in appetite or weight  constipation  headache  muscle aches or pains  nausea This list may not describe all possible side effects. Call your doctor for medical advice about side effects. You may report side effects to FDA at 1-800-FDA-1088. Where should I keep my medicine? Keep out of the reach of children. Store at room temperature between 15 and 30 degrees C (59 to 86 degrees F). Protect from light. Keep container tightly closed. Throw away any unused medicine after the expiration date. NOTE: This sheet is a summary. It may not cover all possible information. If you have questions about this medicine, talk to your doctor, pharmacist, or health care provider.  2020 Elsevier/Gold Standard (2018-10-30 11:46:46) Alprazolam tablets What is this medicine? ALPRAZOLAM (al PRAY zoe lam) is a benzodiazepine. It is used to treat anxiety and panic attacks. This medicine may be used for other purposes; ask your health care provider or pharmacist if you have questions. COMMON BRAND NAME(S): Xanax What should I tell my health care provider before I take this medicine? They need to know if you have any of these conditions:  an alcohol or drug abuse problem  bipolar disorder, depression, psychosis or other mental health conditions  glaucoma  kidney or liver disease  lung or breathing disease  myasthenia gravis  Parkinson's disease  porphyria  seizures or a history of seizures  suicidal thoughts  an unusual or allergic reaction to alprazolam, other benzodiazepines, foods, dyes, or preservatives  pregnant or trying to get pregnant  breast-feeding How should I use this medicine? Take this medicine by mouth with a glass of water. Follow the directions on the prescription  label. Take your medicine at regular intervals. Do not take it more often than directed. Do not stop taking except on your doctor's advice. A special MedGuide will be given to you by the pharmacist with each prescription and refill. Be sure to read this information carefully each time. Talk to your pediatrician regarding the use of this medicine in children. Special care may be needed. Overdosage: If you think you have taken too much of this medicine contact a poison control center or emergency room at once. NOTE: This medicine is only for you. Do not share this medicine with others. What if I miss a dose? If you miss a dose, take it as soon as you can. If it is almost time for your next dose, take only that dose. Do not take double or extra doses. What may interact with this medicine? Do not take this medicine with any of the following medications:  certain antiviral medicines  for HIV or AIDS like delavirdine, indinavir  certain medicines for fungal infections like ketoconazole and itraconazole  narcotic medicines for cough  sodium oxybate This medicine may also interact with the following medications:  alcohol  antihistamines for allergy, cough and cold  certain antibiotics like clarithromycin, erythromycin, isoniazid, rifampin, rifapentine, rifabutin, and troleandomycin  certain medicines for blood pressure, heart disease, irregular heart beat  certain medicines for depression, like amitriptyline, fluoxetine, sertraline  certain medicines for seizures like carbamazepine, oxcarbazepine, phenobarbital, phenytoin, primidone  cimetidine  cyclosporine  female hormones, like estrogens or progestins and birth control pills, patches, rings, or injections  general anesthetics like halothane, isoflurane, methoxyflurane, propofol  grapefruit juice  local anesthetics like lidocaine, pramoxine, tetracaine  medicines that relax muscles for surgery  narcotic medicines for pain   other antiviral medicines for HIV or AIDS  phenothiazines like chlorpromazine, mesoridazine, prochlorperazine, thioridazine This list may not describe all possible interactions. Give your health care provider a list of all the medicines, herbs, non-prescription drugs, or dietary supplements you use. Also tell them if you smoke, drink alcohol, or use illegal drugs. Some items may interact with your medicine. What should I watch for while using this medicine? Tell your doctor or health care professional if your symptoms do not start to get better or if they get worse. Do not stop taking except on your doctor's advice. You may develop a severe reaction. Your doctor will tell you how much medicine to take. You may get drowsy or dizzy. Do not drive, use machinery, or do anything that needs mental alertness until you know how this medicine affects you. To reduce the risk of dizzy and fainting spells, do not stand or sit up quickly, especially if you are an older patient. Alcohol may increase dizziness and drowsiness. Avoid alcoholic drinks. If you are taking another medicine that also causes drowsiness, you may have more side effects. Give your health care provider a list of all medicines you use. Your doctor will tell you how much medicine to take. Do not take more medicine than directed. Call emergency for help if you have problems breathing or unusual sleepiness. What side effects may I notice from receiving this medicine? Side effects that you should report to your doctor or health care professional as soon as possible:  allergic reactions like skin rash, itching or hives, swelling of the face, lips, or tongue  breathing problems  confusion  loss of balance or coordination  signs and symptoms of low blood pressure like dizziness; feeling faint or lightheaded, falls; unusually weak or tired  suicidal thoughts or other mood changes Side effects that usually do not require medical attention (report  to your doctor or health care professional if they continue or are bothersome):  dizziness  dry mouth  nausea, vomiting  tiredness This list may not describe all possible side effects. Call your doctor for medical advice about side effects. You may report side effects to FDA at 1-800-FDA-1088. Where should I keep my medicine? Keep out of the reach of children. This medicine can be abused. Keep your medicine in a safe place to protect it from theft. Do not share this medicine with anyone. Selling or giving away this medicine is dangerous and against the law. Store at room temperature between 20 and 25 degrees C (68 and 77 degrees F). This medicine may cause accidental overdose and death if taken by other adults, children, or pets. Mix any unused medicine with a substance like cat litter or coffee  grounds. Then throw the medicine away in a sealed container like a sealed bag or a coffee can with a lid. Do not use the medicine after the expiration date. NOTE: This sheet is a summary. It may not cover all possible information. If you have questions about this medicine, talk to your doctor, pharmacist, or health care provider.  2020 Elsevier/Gold Standard (2015-08-06 13:47:25)  Managing Loss, Adult People experience loss in many different ways throughout their lives. Events such as moving, changing jobs, and losing friends can create a sense of loss. The loss may be as serious as a major health change, divorce, death of a pet, or death of a loved one. All of these types of loss are likely to create a physical and emotional reaction known as grief. Grief is the result of a major change or an absence of something or someone that you count on. Grief is a normal reaction to loss. A variety of factors can affect your grieving experience, including:  The nature of your loss.  Your relationship to what or whom you lost.  Your understanding of grief and how to manage it.  Your support system. How to  manage lifestyle changes Keep to your normal routine as much as possible.  If you have trouble focusing or doing normal activities, it is acceptable to take some time away from your normal routine.  Spend time with friends and loved ones.  Eat a healthy diet, get plenty of sleep, and rest when you feel tired. How to recognize changes  The way that you deal with your grief will affect your ability to function as you normally do. When grieving, you may experience these changes:  Numbness, shock, sadness, anxiety, anger, denial, and guilt.  Thoughts about death.  Unexpected crying.  A physical sensation of emptiness in your stomach.  Problems sleeping and eating.  Tiredness (fatigue).  Loss of interest in normal activities.  Dreaming about or imagining seeing the person who died.  A need to remember what or whom you lost.  Difficulty thinking about anything other than your loss for a period of time.  Relief. If you have been expecting the loss for a while, you may feel a sense of relief when it happens. Follow these instructions at home:  Activity Express your feelings in healthy ways, such as:  Talking with others about your loss. It may be helpful to find others who have had a similar loss, such as a support group.  Writing down your feelings in a journal.  Doing physical activities to release stress and emotional energy.  Doing creative activities like painting, sculpting, or playing or listening to music.  Practicing resilience. This is the ability to recover and adjust after facing challenges. Reading some resources that encourage resilience may help you to learn ways to practice those behaviors. General instructions  Be patient with yourself and others. Allow the grieving process to happen, and remember that grieving takes time. ? It is likely that you may never feel completely done with some grief. You may find a way to move on while still cherishing memories and  feelings about your loss. ? Accepting your loss is a process. It can take months or longer to adjust.  Keep all follow-up visits as told by your health care provider. This is important. Where to find support To get support for managing loss:  Ask your health care provider for help and recommendations, such as grief counseling or therapy.  Think about joining a  support group for people who are managing a loss. Where to find more information You can find more information about managing loss from:  American Society of Clinical Oncology: www.cancer.net  American Psychological Association: [employer] Contact a health care provider if:  Your grief is extreme and keeps getting worse.  You have ongoing grief that does not improve.  Your body shows symptoms of grief, such as illness.  You feel depressed, anxious, or lonely. Get help right away if:  You have thoughts about hurting yourself or others. If you ever feel like you may hurt yourself or others, or have thoughts about taking your own life, get help right away. You can go to your nearest emergency department or call:  Your local emergency services (911 in the [place]  A suicide crisis helpline, such as the National Suicide Prevention Lifeline at 364-121-2860. This is open 24 hours a day. Summary  Grief is the result of a major change or an absence of someone or something that you count on. Grief is a normal reaction to loss.  The depth of grief and the period of recovery depend on the type of loss and your ability to adjust to the change and process your feelings.  Processing grief requires patience and a willingness to accept your feelings and talk about your loss with people who are supportive.  It is important to find resources that work for you and to realize that people experience grief differently. There is not one grieving process that works for everyone in the same way.  Be aware that when grief becomes extreme, it  can lead to more severe issues like isolation, depression, anxiety, or suicidal thoughts. Talk with your health care provider if you have any of these issues. This information is not intended to replace advice given to you by your health care provider. Make sure you discuss any questions you have with your health care provider. Document Released: 03/23/2017 Document Revised: 01/11/2019 Document Reviewed: 03/23/2017 Elsevier Patient Education  2020 [employer].

## 2019-10-16 NOTE — Progress Notes (Signed)
[facility]   Established Patient Office Visit  Subjective:  Patient ID: Destiny Cook, female    DOB: July 23, 1986  Age: 33 y.o. MRN: [id number]  CC:  Chief Complaint  Patient presents with  . Follow-up    DM   . Sciatica Pain    Right side lower back  &  Right leg pain    HPI Destiny Cook is a 33 year old female who presents for Follow Up today.   Past Medical History:  Diagnosis Date  . Anxiety   . Chronic back pain   . Chronic left hip pain   . Dental caries   . Hypertension   . Insomnia 09/2019   Current Status: Since her last office visit, she is doing well with no complaints. She denies fevers, chills, fatigue, recent infections, weight loss, and night sweats. She has not had any headaches, visual changes, dizziness, and falls. No chest pain, heart palpitations, cough and shortness of breath reported. No reports of GI problems such as nausea, vomiting, diarrhea, and constipation. She has no reports of blood in stools, dysuria and hematuria. Her anxiety is increased todaydenies suicidal ideations, homicidal ideations, or auditory hallucinations. She denies pain today.   Past Surgical History:  Procedure Laterality Date  . CESAREAN SECTION     x 4  . TUBAL LIGATION      Family History  Problem Relation Age of Onset  . Hypertension Mother   . Heart disease Father     Social History   Socioeconomic History  . Marital status: Single    Spouse name: Not on file  . Number of children: Not on file  . Years of education: Not on file  . Highest education level: Not on file  Occupational History  . Not on file  Social Needs  . Financial resource strain: Not on file  . Food insecurity    Worry: Not on file    Inability: Not on file  . Transportation needs    Medical: Not on file    Non-medical: Not on file  Tobacco Use  . Smoking status: Current Every Day Smoker    Packs/day: 0.50    Years: 15.00   Pack years: 7.50    Types: Cigarettes  . Smokeless tobacco: Never Used  . Tobacco comment: started age 33  Substance and Sexual Activity  . Alcohol use: No  . Drug use: No  . Sexual activity: Yes    Partners: Male    Birth control/protection: Surgical  Lifestyle  . Physical activity    Days per week: Not on file    Minutes per session: Not on file  . Stress: Not on file  Relationships  . Social Musicianconnections    Talks on phone: Not on file    Gets together: Not on file    Attends religious service: Not on file    Active member of club or organization: Not on file    Attends meetings of clubs or organizations: Not on file    Relationship status: Not on file  . Intimate partner violence    Fear of current or ex partner: Not on file    Emotionally abused: Not on file    Physically abused: Not on file    Forced sexual activity: Not on file  Other Topics Concern  . Not on file  Social History Narrative  . Not on file    Outpatient Medications Prior to Visit  Medication  Sig Dispense Refill  . cyclobenzaprine (FLEXERIL) 10 MG tablet Take 1 tablet (10 mg total) by mouth 3 (three) times daily as needed for muscle spasms. (Patient not taking: Reported on 10/16/2019) 30 tablet 1  . gabapentin (NEURONTIN) 100 MG capsule Take 1 capsule (100 mg total) by mouth 2 (two) times daily. (Patient not taking: Reported on 10/16/2019) 60 capsule 1  . hydrochlorothiazide (HYDRODIURIL) 25 MG tablet Take 1 tablet (25 mg total) by mouth daily. (Patient not taking: Reported on 10/16/2019) 30 tablet 1  . ibuprofen (ADVIL) 800 MG tablet Take 1 tablet (800 mg total) by mouth every 8 (eight) hours as needed. (Patient not taking: Reported on 10/16/2019) 30 tablet 1  . metFORMIN (GLUCOPHAGE) 500 MG tablet Take 1 tablet (500 mg total) by mouth 2 (two) times daily with a meal. (Patient not taking: Reported on 10/16/2019) 60 tablet 1   No facility-administered medications prior to visit.     Allergies  Allergen  Reactions  . Penicillins Itching and Rash    ROS Review of Systems  Constitutional: Negative.   HENT: Negative.   Eyes: Negative.   Respiratory: Negative.   Cardiovascular: Negative.   Gastrointestinal: Negative.   Endocrine: Negative.   Genitourinary: Negative.   Musculoskeletal: Positive for back pain (chronic).  Skin: Negative.   Allergic/Immunologic: Negative.   Neurological: Positive for numbness (numbness/tingling/pain ).  Hematological: Negative.   Psychiatric/Behavioral: Positive for sleep disturbance (insomnia).      Objective:    Physical Exam  Constitutional: She is oriented to person, place, and time. She appears well-developed and well-nourished.  HENT:  Head: Normocephalic and atraumatic.  Eyes: Conjunctivae are normal.  Neck: Normal range of motion. Neck supple.  Cardiovascular: Normal rate, regular rhythm, normal heart sounds and intact distal pulses.  Pulmonary/Chest: Effort normal and breath sounds normal.  Abdominal: Soft. Bowel sounds are normal.  Musculoskeletal: Normal range of motion.  Neurological: She is alert and oriented to person, place, and time. She has normal reflexes.  Skin: Skin is warm and dry.  Psychiatric: She has a normal mood and affect. Her behavior is normal. Judgment and thought content normal.  Nursing note and vitals reviewed.   BP 123/80   Pulse 97   Temp 98 F (36.7 C) (Oral)   Ht  (1.6 m)   Wt 261 lb 9.6 oz (118.7 kg)   LMP 09/28/2019   SpO2 98%   BMI 46.34 kg/m  Wt Readings from Last 3 Encounters:  10/16/19 261 lb 9.6 oz (118.7 kg)  05/01/19 256 lb (116.1 kg)  03/26/19 262 lb (118.8 kg)     Health Maintenance Due  Topic Date Due  . PAP SMEAR-Modifier  07/01/2007  . INFLUENZA VACCINE  06/22/2019    There are no preventive care reminders to display for this patient.  No results found for: TSH Lab Results  Component Value Date   WBC 8.5 03/26/2019   HGB 12.3 03/26/2019   HCT 37.5 03/26/2019   MCV  84.1 03/26/2019   PLT 274 03/26/2019   Lab Results  Component Value Date   NA 138 03/26/2019   K 3.6 03/26/2019   CO2 22 03/26/2019   GLUCOSE 108 (H) 03/26/2019   BUN 14 03/26/2019   CREATININE 0.94 03/26/2019   BILITOT 0.2 (L) 03/12/2019   ALKPHOS 65 03/12/2019   AST 22 03/12/2019   ALT 26 03/12/2019   PROT 6.7 03/12/2019   ALBUMIN 3.7 03/12/2019   CALCIUM 8.6 (L) 03/26/2019   ANIONGAP 8 03/26/2019  No results found for: CHOL No results found for: HDL No results found for: LDLCALC No results found for: TRIG No results found for: [facility] Lab Results  Component Value Date   HGBA1C 5.3 10/16/2019   Assessment & Plan:   1. Prediabetes Glucose at 115 today. She will continue medication as prescribed, to decrease foods/beverages high in sugars and carbs and follow Heart Healthy or DASH diet. Increase physical activity to at least 30 minutes cardio exercise daily.  - Urinalysis Dipstick - POCT HgB A1C - Glucose (CBG) - metFORMIN (GLUCOPHAGE) 500 MG tablet; Take 1 tablet (500 mg total) by mouth 2 (two) times daily with a meal.  Dispense: 60 tablet; Refill: 3  2. Hypertension, unspecified type The current medical regimen is effective; blood pressure is stable at 123/80 today; continue present plan and medications as prescribed. She will continue to take medications as prescribed, to decrease high sodium intake, excessive alcohol intake, increase potassium intake, smoking cessation, and increase physical activity of at least 30 minutes of cardio activity daily. She will continue to follow Heart Healthy or DASH diet. - hydrochlorothiazide (HYDRODIURIL) 25 MG tablet; Take 1 tablet (25 mg total) by mouth daily.  Dispense: 30 tablet; Refill: 3  3. Grieving We will initiate Alprazolam today.  - ALPRAZolam (XANAX) 0.5 MG tablet; Take 1 tablet (0.5 mg total) by mouth at bedtime as needed for anxiety.  Dispense: 30 tablet; Refill: 0  4. Neuropathy - gabapentin (NEURONTIN) 100 MG  capsule; Take 1 capsule (100 mg total) by mouth 2 (two) times daily.  Dispense: 60 capsule; Refill: 3  5. Chronic bilateral low back pain with sciatica, sciatica laterality unspecified  6. Class 3 severe obesity due to excess calories with serious comorbidity and body mass index (BMI) of 45.0 to 49.9 in adult [facility]) Body mass index is 46.34 kg/m.  Goal BMI  is <30. Encouraged efforts to reduce weight include engaging in physical activity as tolerated with goal of 150 minutes per week. Improve dietary choices and eat a meal regimen consistent with a Mediterranean or DASH diet. Reduce simple carbohydrates. Do not skip meals and eat healthy snacks throughout the day to avoid over-eating at dinner. Set a goal weight loss that is achievable for you.  7. Morbidly obese ([place])  8. Chronic back pain, unspecified back location, unspecified back pain laterality - cyclobenzaprine (FLEXERIL) 10 MG tablet; Take 1 tablet (10 mg total) by mouth 3 (three) times daily as needed for muscle spasms.  Dispense: 30 tablet; Refill: 3 - ibuprofen (ADVIL) 800 MG tablet; Take 1 tablet (800 mg total) by mouth every 8 (eight) hours as needed.  Dispense: 30 tablet; Refill: 3  9. Hot flashes - gabapentin (NEURONTIN) 100 MG capsule; Take 1 capsule (100 mg total) by mouth 2 (two) times daily.  Dispense: 60 capsule; Refill: 3  10. Insomnia, unspecified type - traZODone (DESYREL) 100 MG tablet; Take 1 tablet (100 mg total) by mouth at bedtime.  Dispense: 30 tablet; Refill: 3  11. Follow up She will follow up in 3 months.   Meds ordered this encounter  Medications  . cyclobenzaprine (FLEXERIL) 10 MG tablet    Sig: Take 1 tablet (10 mg total) by mouth 3 (three) times daily as needed for muscle spasms.    Dispense:  30 tablet    Refill:  3  . gabapentin (NEURONTIN) 100 MG capsule    Sig: Take 1 capsule (100 mg total) by mouth 2 (two) times daily.    Dispense:  60  capsule    Refill:  3  . hydrochlorothiazide (HYDRODIURIL)  25 MG tablet    Sig: Take 1 tablet (25 mg total) by mouth daily.    Dispense:  30 tablet    Refill:  3  . ibuprofen (ADVIL) 800 MG tablet    Sig: Take 1 tablet (800 mg total) by mouth every 8 (eight) hours as needed.    Dispense:  30 tablet    Refill:  3  . metFORMIN (GLUCOPHAGE) 500 MG tablet    Sig: Take 1 tablet (500 mg total) by mouth 2 (two) times daily with a meal.    Dispense:  60 tablet    Refill:  3  . traZODone (DESYREL) 100 MG tablet    Sig: Take 1 tablet (100 mg total) by mouth at bedtime.    Dispense:  30 tablet    Refill:  3  . ALPRAZolam (XANAX) 0.5 MG tablet    Sig: Take 1 tablet (0.5 mg total) by mouth at bedtime as needed for anxiety.    Dispense:  30 tablet    Refill:  0    Recent death of family member. One time only fill for this medication. Patient is aware. Thank you.    Order Specific Question:   Supervising Provider    Answer:   Quentin Angst [[id number]]    Orders Placed This Encounter  Procedures  . Flu Vaccine QUAD 6+ mos PF IM (Fluarix Quad PF)  . Urinalysis Dipstick  . POCT HgB A1C  . Glucose (CBG)    Referral Orders  No referral(s) requested today    Raliegh Ip,  MSN, FNP-BC [facility] [address] [place], [place] [address] [phone] fax  Problem List Items Addressed This Visit      Cardiovascular and Mediastinum   Hot flashes   Relevant Medications   gabapentin (NEURONTIN) 100 MG capsule   hydrochlorothiazide (HYDRODIURIL) 25 MG tablet   Hypertension   Relevant Medications   hydrochlorothiazide (HYDRODIURIL) 25 MG tablet     Nervous and Auditory   Neuropathy   Relevant Medications   gabapentin (NEURONTIN) 100 MG capsule     Other   Chronic back pain   Relevant Medications   cyclobenzaprine (FLEXERIL) 10 MG tablet   gabapentin (NEURONTIN) 100 MG capsule   ibuprofen (ADVIL) 800 MG tablet   traZODone (DESYREL) 100 MG tablet    Class 3 severe obesity due to excess calories with serious comorbidity and body mass index (BMI) of 45.0 to 49.9 in adult [facility])   Relevant Medications   metFORMIN (GLUCOPHAGE) 500 MG tablet    Other Visit Diagnoses    Prediabetes    -  Primary   Relevant Medications   metFORMIN (GLUCOPHAGE) 500 MG tablet   Other Relevant Orders   Urinalysis Dipstick (Completed)   POCT HgB A1C (Completed)   Glucose (CBG) (Completed)   Grieving       Relevant Medications   ALPRAZolam (XANAX) 0.5 MG tablet   Morbidly obese ([facility])       Relevant Medications   metFORMIN (GLUCOPHAGE) 500 MG tablet   Insomnia, unspecified type       Relevant Medications   traZODone (DESYREL) 100 MG tablet   Follow up          Meds ordered this encounter  Medications  . cyclobenzaprine (FLEXERIL) 10 MG tablet    Sig: Take 1 tablet (10 mg total) by mouth 3 (  three) times daily as needed for muscle spasms.    Dispense:  30 tablet    Refill:  3  . gabapentin (NEURONTIN) 100 MG capsule    Sig: Take 1 capsule (100 mg total) by mouth 2 (two) times daily.    Dispense:  60 capsule    Refill:  3  . hydrochlorothiazide (HYDRODIURIL) 25 MG tablet    Sig: Take 1 tablet (25 mg total) by mouth daily.    Dispense:  30 tablet    Refill:  3  . ibuprofen (ADVIL) 800 MG tablet    Sig: Take 1 tablet (800 mg total) by mouth every 8 (eight) hours as needed.    Dispense:  30 tablet    Refill:  3  . metFORMIN (GLUCOPHAGE) 500 MG tablet    Sig: Take 1 tablet (500 mg total) by mouth 2 (two) times daily with a meal.    Dispense:  60 tablet    Refill:  3  . traZODone (DESYREL) 100 MG tablet    Sig: Take 1 tablet (100 mg total) by mouth at bedtime.    Dispense:  30 tablet    Refill:  3  . ALPRAZolam (XANAX) 0.5 MG tablet    Sig: Take 1 tablet (0.5 mg total) by mouth at bedtime as needed for anxiety.    Dispense:  30 tablet    Refill:  0    Recent death of family member. One time only fill for this medication. Patient is aware. Thank  you.    Order Specific Question:   Supervising Provider    Answer:   Quentin Angst [[id number]]    Follow-up: Return in about 3 months (around 01/16/2020).    Kallie Locks, FNP

## 2019-10-19 DIAGNOSIS — F4321 Adjustment disorder with depressed mood: Secondary | ICD-10-CM | POA: Insufficient documentation

## 2019-10-19 DIAGNOSIS — G4700 Insomnia, unspecified: Secondary | ICD-10-CM | POA: Insufficient documentation

## 2019-11-13 ENCOUNTER — Other Ambulatory Visit: Payer: Self-pay | Admitting: Family Medicine

## 2019-11-13 DIAGNOSIS — G8929 Other chronic pain: Secondary | ICD-10-CM

## 2019-11-13 DIAGNOSIS — M5440 Lumbago with sciatica, unspecified side: Secondary | ICD-10-CM

## 2019-11-13 NOTE — Telephone Encounter (Signed)
Please fill if appropriate.  

## 2019-11-19 ENCOUNTER — Ambulatory Visit (INDEPENDENT_AMBULATORY_CARE_PROVIDER_SITE_OTHER): Payer: BC Managed Care – PPO | Admitting: Family Medicine

## 2019-11-19 ENCOUNTER — Encounter: Payer: Self-pay | Admitting: Family Medicine

## 2019-11-19 ENCOUNTER — Other Ambulatory Visit: Payer: Self-pay

## 2019-11-19 VITALS — BP 115/72 | HR 118 | Temp 97.6°F | Ht 63.0 in | Wt 260.0 lb

## 2019-11-19 DIAGNOSIS — Z3202 Encounter for pregnancy test, result negative: Secondary | ICD-10-CM | POA: Diagnosis not present

## 2019-11-19 DIAGNOSIS — Z6841 Body Mass Index (BMI) 40.0 and over, adult: Secondary | ICD-10-CM

## 2019-11-19 DIAGNOSIS — F419 Anxiety disorder, unspecified: Secondary | ICD-10-CM

## 2019-11-19 DIAGNOSIS — Z23 Encounter for immunization: Secondary | ICD-10-CM

## 2019-11-19 DIAGNOSIS — Z09 Encounter for follow-up examination after completed treatment for conditions other than malignant neoplasm: Secondary | ICD-10-CM

## 2019-11-19 DIAGNOSIS — M5441 Lumbago with sciatica, right side: Secondary | ICD-10-CM

## 2019-11-19 DIAGNOSIS — E66813 Obesity, class 3: Secondary | ICD-10-CM

## 2019-11-19 DIAGNOSIS — F4321 Adjustment disorder with depressed mood: Secondary | ICD-10-CM

## 2019-11-19 DIAGNOSIS — Z7251 High risk heterosexual behavior: Secondary | ICD-10-CM | POA: Diagnosis not present

## 2019-11-19 DIAGNOSIS — I10 Essential (primary) hypertension: Secondary | ICD-10-CM

## 2019-11-19 DIAGNOSIS — G8929 Other chronic pain: Secondary | ICD-10-CM

## 2019-11-19 DIAGNOSIS — R7303 Prediabetes: Secondary | ICD-10-CM

## 2019-11-19 DIAGNOSIS — M5440 Lumbago with sciatica, unspecified side: Secondary | ICD-10-CM

## 2019-11-19 LAB — POCT URINALYSIS DIPSTICK
Bilirubin, UA: NEGATIVE
Blood, UA: NEGATIVE
Glucose, UA: NEGATIVE
Ketones, UA: NEGATIVE
Leukocytes, UA: NEGATIVE
Nitrite, UA: NEGATIVE
Protein, UA: NEGATIVE
Spec Grav, UA: 1.025 (ref 1.010–1.025)
Urobilinogen, UA: 1 E.U./dL
pH, UA: 5.5 (ref 5.0–8.0)

## 2019-11-19 LAB — POCT URINE PREGNANCY: Preg Test, Ur: NEGATIVE

## 2019-11-19 MED ORDER — ALPRAZOLAM 0.5 MG PO TABS: 0.5000 mg | ORAL_TABLET | Freq: Every evening | ORAL | 0 refills | Status: DC | PRN

## 2019-11-19 MED ORDER — BUSPIRONE HCL 5 MG PO TABS: 5.0000 mg | ORAL_TABLET | Freq: Two times a day (BID) | ORAL | 3 refills | Status: DC

## 2019-11-19 NOTE — Progress Notes (Signed)
[facility]   Established Patient Office Visit  Subjective:  Patient ID: Destiny Cook, female    DOB: [birth year]  Age: 33 y.o. MRN: [id number]  CC:  Chief ComAbagail Cook  Patient presents with  . Follow-up    Medication refills  . Anxiety    Panic attacks    HPI Destiny Cook is a 33 year old female who presents for Follow Up today.   Past Medical History:  Diagnosis Date  . Anxiety   . Chronic back pain   . Chronic left hip pain   . Dental caries   . Hypertension   . Insomnia 09/2019   Current Status: Since her last office visit, she has c/o anxiety r/t the recent death of her father. She continues to have increased anxiety, panic attacks and is very tearful. She is originally from [place] and has been in [place] 3 years. She has no other family here besides her children. She denies suicidal ideations, homicidal ideations, or auditory hallucinations.She denies visual changes, chest pain, cough, shortness of breath, heart palpitations, and falls. She has occasional headaches and dizziness with position changes. Denies severe headaches, confusion, seizures, double vision, and blurred vision, nausea and vomiting.  She denies fevers, chills, fatigue, recent infections, weight loss, and night sweats.  No reports of GI problems such as nausea, vomiting, diarrhea, and constipation. She has no reports of blood in stools, dysuria and hematuria. She denies pain today.   Past Surgical History:  Procedure Laterality Date  . CESAREAN SECTION     x 4  . TUBAL LIGATION      Family History  Problem Relation Age of Onset  . Hypertension Mother   . Heart disease Father     Social History   Socioeconomic History  . Marital status: Single    Spouse name: Not on file  . Number of children: Not on file  . Years of education: Not on file  . Highest education level: Not on file  Occupational History  . Not on file  Tobacco Use  .  Smoking status: Current Every Day Smoker    Packs/day: 0.50    Years: 15.00    Pack years: 7.50    Types: Cigarettes  . Smokeless tobacco: Never Used  . Tobacco comment: started age 33  Substance and Sexual Activity  . Alcohol use: No  . Drug use: No  . Sexual activity: Yes    Partners: Male    Birth control/protection: Surgical  Other Topics Concern  . Not on file  Social History Narrative  . Not on file   Social Determinants of Health   Financial Resource Strain:   . Difficulty of Paying Living Expenses: Not on file  Food Insecurity:   . Worried About Programme researcher, broadcasting/film/videounning Out of Food in the Last Year: Not on file  . Ran Out of Food in the Last Year: Not on file  Transportation Needs:   . Lack of Transportation (Medical): Not on file  . Lack of Transportation (Non-Medical): Not on file  Physical Activity:   . Days of Exercise per Week: Not on file  . Minutes of Exercise per Session: Not on file  Stress:   . Feeling of Stress : Not on file  Social Connections:   . Frequency of Communication with Friends and Family: Not on file  . Frequency of Social Gatherings with Friends and Family: Not on file  . Attends Religious Services: Not on file  .  Active Member of Clubs or Organizations: Not on file  . Attends Banker Meetings: Not on file  . Marital Status: Not on file  Intimate Partner Violence:   . Fear of Current or Ex-Partner: Not on file  . Emotionally Abused: Not on file  . Physically Abused: Not on file  . Sexually Abused: Not on file    Outpatient Medications Prior to Visit  Medication Sig Dispense Refill  . cyclobenzaprine (FLEXERIL) 10 MG tablet Take 1 tablet (10 mg total) by mouth 3 (three) times daily as needed for muscle spasms. 30 tablet 3  . gabapentin (NEURONTIN) 100 MG capsule Take 1 capsule (100 mg total) by mouth 2 (two) times daily. 60 capsule 3  . hydrochlorothiazide (HYDRODIURIL) 25 MG tablet Take 1 tablet (25 mg total) by mouth daily. 30 tablet 3    . ibuprofen (ADVIL) 800 MG tablet Take 1 tablet (800 mg total) by mouth every 8 (eight) hours as needed. 30 tablet 3  . metFORMIN (GLUCOPHAGE) 500 MG tablet Take 1 tablet (500 mg total) by mouth 2 (two) times daily with a meal. 60 tablet 3  . traZODone (DESYREL) 100 MG tablet Take 1 tablet (100 mg total) by mouth at bedtime. 30 tablet 3  . traMADol (ULTRAM) 50 MG tablet TAKE 1 TABLET(50 MG) BY MOUTH EVERY 8 HOURS AS NEEDED FOR SEVERE PAIN (Patient not taking: Reported on 11/19/2019) 30 tablet 0  . ALPRAZolam (XANAX) 0.5 MG tablet Take 1 tablet (0.5 mg total) by mouth at bedtime as needed for anxiety. 30 tablet 0   No facility-administered medications prior to visit.    Allergies  Allergen Reactions  . Penicillins Itching and Rash    ROS Review of Systems  Constitutional: Negative.   HENT: Negative.   Eyes: Negative.   Respiratory: Negative.   Cardiovascular: Negative.   Gastrointestinal: Positive for abdominal distention.  Endocrine: Negative.   Genitourinary: Negative.   Musculoskeletal: Positive for arthralgias (generalized) and back pain (chronic back pain).  Skin: Negative.   Allergic/Immunologic: Negative.   Neurological: Positive for dizziness (occasional ) and headaches (occasional ).  Hematological: Negative.   Psychiatric/Behavioral: Negative.       Objective:    Physical Exam  Constitutional: She is oriented to person, place, and time. She appears well-developed and well-nourished.  HENT:  Head: Normocephalic and atraumatic.  Eyes: Conjunctivae are normal.  Cardiovascular: Normal rate, regular rhythm, normal heart sounds and intact distal pulses.  Pulmonary/Chest: Effort normal and breath sounds normal.  Abdominal: Soft. Bowel sounds are normal. She exhibits distension (obese).  Musculoskeletal:        General: Normal range of motion.     Cervical back: Normal range of motion and neck supple.  Neurological: She is alert and oriented to person, place, and  time. She has normal reflexes.  Skin: Skin is warm and dry.  Psychiatric: She has a normal mood and affect. Her behavior is normal. Judgment and thought content normal.  Nursing note and vitals reviewed.   BP 115/72   Pulse (!) 118   Temp 97.6 F (36.4 C) (Oral)   Ht  (1.6 m)   Wt 260 lb (117.9 kg)   SpO2 99%   BMI 46.06 kg/m  Wt Readings from Last 3 Encounters:  11/19/19 260 lb (117.9 kg)  10/16/19 261 lb 9.6 oz (118.7 kg)  05/01/19 256 lb (116.1 kg)     Health Maintenance Due  Topic Date Due  . PAP SMEAR-Modifier  07/01/2007  There are no preventive care reminders to display for this patient.  No results found for: TSH Lab Results  Component Value Date   WBC 8.5 03/26/2019   HGB 12.3 03/26/2019   HCT 37.5 03/26/2019   MCV 84.1 03/26/2019   PLT 274 03/26/2019   Lab Results  Component Value Date   NA 138 03/26/2019   K 3.6 03/26/2019   CO2 22 03/26/2019   GLUCOSE 108 (H) 03/26/2019   BUN 14 03/26/2019   CREATININE 0.94 03/26/2019   BILITOT 0.2 (L) 03/12/2019   ALKPHOS 65 03/12/2019   AST 22 03/12/2019   ALT 26 03/12/2019   PROT 6.7 03/12/2019   ALBUMIN 3.7 03/12/2019   CALCIUM 8.6 (L) 03/26/2019   ANIONGAP 8 03/26/2019   No results found for: CHOL No results found for: HDL No results found for: LDLCALC No results found for: TRIG No results found for: [facility] Lab Results  Component Value Date   HGBA1C 5.3 10/16/2019   Assessment & Plan:   1. Unprotected sexual intercourse - POCT urine pregnancy - Urinalysis Dipstick  2. Grieving We will initiate Buspirone today. We will give one final refill of Alprazolam.  - busPIRone (BUSPAR) 5 MG tablet; Take 1 tablet (5 mg total) by mouth 2 (two) times daily.  Dispense: 60 tablet; Refill: 3 - ALPRAZolam (XANAX) 0.5 MG tablet; Take 1 tablet (0.5 mg total) by mouth at bedtime as needed for anxiety. (Patient not taking: Reported on 11/19/2019)  Dispense: 30 tablet; Refill: 0  3. Anxiety  4.  Hypertension, unspecified type The current medical regimen is effective; blood pressure is stable at 115/72 today; continue present plan and medications as prescribed. She will continue to take medications as prescribed, to decrease high sodium intake, excessive alcohol intake, increase potassium intake, smoking cessation, and increase physical activity of at least 30 minutes of cardio activity daily. She will continue to follow Heart Healthy or DASH diet.  5. Class 3 severe obesity due to excess calories with serious comorbidity and body mass index (BMI) of 45.0 to 49.9 in adult [facility]) Body mass index is 46.06 kg/m. Goal BMI  is <30. Encouraged efforts to reduce weight include engaging in physical activity as tolerated with goal of 150 minutes per week. Improve dietary choices and eat a meal regimen consistent with a Mediterranean or DASH diet. Reduce simple carbohydrates. Do not skip meals and eat healthy snacks throughout the day to avoid over-eating at dinner. Set a goal weight loss that is achievable for you.  6. Chronic bilateral low back pain with sciatica, sciatica laterality unspecified  7. Morbidly obese ([place])  8. Prediabetes  9. Follow up She will follow up in 3 months.   Meds ordered this encounter  Medications  . busPIRone (BUSPAR) 5 MG tablet    Sig: Take 1 tablet (5 mg total) by mouth 2 (two) times daily.    Dispense:  60 tablet    Refill:  3  . ALPRAZolam (XANAX) 0.5 MG tablet    Sig: Take 1 tablet (0.5 mg total) by mouth at bedtime as needed for anxiety.    Dispense:  30 tablet    Refill:  0    Recent death of family member. One time only fill for this medication. Patient is aware. Thank you.    Orders Placed This Encounter  Procedures  . Flu Vaccine QUAD 6+ mos PF IM (Fluarix Quad PF)  . POCT urine pregnancy  . Urinalysis Dipstick    Referral Orders  No  referral(s) requested today    Raliegh Ip,  MSN, FNP-BC [facility] [address] [place], [place] [address] [phone] fax  Problem List Items Addressed This Visit      Cardiovascular and Mediastinum   Hypertension     Other   Anxiety   Relevant Medications   busPIRone (BUSPAR) 5 MG tablet   ALPRAZolam (XANAX) 0.5 MG tablet   Chronic back pain   Class 3 severe obesity due to excess calories with serious comorbidity and body mass index (BMI) of 45.0 to 49.9 in adult [facility])   Grieving   Relevant Medications   busPIRone (BUSPAR) 5 MG tablet   ALPRAZolam (XANAX) 0.5 MG tablet   Morbidly obese ([facility])    Other Visit Diagnoses    Unprotected sexual intercourse    -  Primary   Relevant Orders   POCT urine pregnancy (Completed)   Urinalysis Dipstick (Completed)   Prediabetes       Follow up          Meds ordered this encounter  Medications  . busPIRone (BUSPAR) 5 MG tablet    Sig: Take 1 tablet (5 mg total) by mouth 2 (two) times daily.    Dispense:  60 tablet    Refill:  3  . ALPRAZolam (XANAX) 0.5 MG tablet    Sig: Take 1 tablet (0.5 mg total) by mouth at bedtime as needed for anxiety.    Dispense:  30 tablet    Refill:  0    Recent death of family member. One time only fill for this medication. Patient is aware. Thank you.    Follow-up: Return in about 3 months (around 02/17/2020).    Kallie Locks, FNP

## 2020-01-15 ENCOUNTER — Ambulatory Visit: Payer: BC Managed Care – PPO | Admitting: Family Medicine

## 2020-02-12 ENCOUNTER — Ambulatory Visit: Payer: BC Managed Care – PPO | Admitting: Family Medicine

## 2020-05-12 ENCOUNTER — Emergency Department (HOSPITAL_COMMUNITY)
Admission: EM | Admit: 2020-05-12 | Discharge: 2020-05-12 | Disposition: A | Discharge status: Home / Self Care | Payer: BC Managed Care – PPO | Attending: Emergency Medicine | Admitting: Emergency Medicine

## 2020-05-12 ENCOUNTER — Other Ambulatory Visit: Payer: Self-pay

## 2020-05-12 ENCOUNTER — Encounter (HOSPITAL_COMMUNITY): Payer: Self-pay | Admitting: Emergency Medicine

## 2020-05-12 DIAGNOSIS — K0889 Other specified disorders of teeth and supporting structures: Secondary | ICD-10-CM | POA: Diagnosis present

## 2020-05-12 DIAGNOSIS — I10 Essential (primary) hypertension: Secondary | ICD-10-CM | POA: Diagnosis not present

## 2020-05-12 DIAGNOSIS — K029 Dental caries, unspecified: Secondary | ICD-10-CM | POA: Diagnosis not present

## 2020-05-12 DIAGNOSIS — F17210 Nicotine dependence, cigarettes, uncomplicated: Secondary | ICD-10-CM | POA: Insufficient documentation

## 2020-05-12 MED ORDER — OXYCODONE-ACETAMINOPHEN 5-325 MG PO TABS
2.0000 | ORAL_TABLET | Freq: Once | ORAL | Status: AC
  Administered 2020-05-12: 2 via ORAL
  Filled 2020-05-12: qty 2

## 2020-05-12 MED ORDER — DOXYCYCLINE HYCLATE 100 MG PO CAPS: 100.0000 mg | ORAL_CAPSULE | Freq: Two times a day (BID) | ORAL | 0 refills | Status: DC

## 2020-05-12 NOTE — ED Provider Notes (Signed)
[place] DEPT Provider Note   CSN: [id number] Arrival date & time: 05/12/20  0820     History Chief Complaint  Patient presents with  . Dental Pain    Destiny Cook is a 34 y.o. female.  HPI 34 year old female presents today complaining of pain in her left upper teeth.  She had no known injury.  Pain is been increasing.  She feels that there is some swelling.  She has taken ibuprofen without relief.  She has known poor dentition with previous caries.  Denies fever, difficulty speaking or swallowing.    Past Medical History:  Diagnosis Date  . Anxiety   . Chronic back pain   . Chronic left hip pain   . Dental caries   . Hypertension   . Insomnia 09/2019    Patient Active Problem List   Diagnosis Date Noted  . Grieving 10/19/2019  . Morbidly obese ([place]) 10/19/2019  . Insomnia 10/19/2019  . Neuropathy 05/02/2019  . Hot flashes 05/02/2019  . Anxiety 03/27/2019  . Shortness of breath 03/27/2019  . Hypertension 03/27/2019  . Chronic back pain 03/27/2019  . Dental caries 03/27/2019  . Class 3 severe obesity due to excess calories with serious comorbidity and body mass index (BMI) of 45.0 to 49.9 in adult [facility]) 03/27/2019    Past Surgical History:  Procedure Laterality Date  . CESAREAN SECTION     x 4  . TUBAL LIGATION       OB History    Gravida  5   Para  4   Term  2   Preterm  2   AB  1   Living  4     SAB  1   TAB  0   Ectopic  0   Multiple  0   Live Births  4        Obstetric Comments  c-sect x 4        Family History  Problem Relation Age of Onset  . Hypertension Mother   . Heart disease Father     Social History   Tobacco Use  . Smoking status: Current Every Day Smoker    Packs/day: 0.50    Years: 15.00    Pack years: 7.50    Types: Cigarettes  . Smokeless tobacco: Never Used  . Tobacco comment: started age 50  Vaping Use  . Vaping Use: Never used  Substance Use Topics  . Alcohol  use: No  . Drug use: No    Home Medications Prior to Admission medications   Medication Sig Start Date End Date Taking? Authorizing Provider  ALPRAZolam Duanne Moron) 0.5 MG tablet Take 1 tablet (0.5 mg total) by mouth at bedtime as needed for anxiety. Patient not taking: Reported on 11/19/2019 11/19/19   Azzie Glatter, FNP  busPIRone (BUSPAR) 5 MG tablet Take 1 tablet (5 mg total) by mouth 2 (two) times daily. 11/19/19   Azzie Glatter, FNP  cyclobenzaprine (FLEXERIL) 10 MG tablet Take 1 tablet (10 mg total) by mouth 3 (three) times daily as needed for muscle spasms. 10/16/19   Azzie Glatter, FNP  gabapentin (NEURONTIN) 100 MG capsule Take 1 capsule (100 mg total) by mouth 2 (two) times daily. 10/16/19   Azzie Glatter, FNP  hydrochlorothiazide (HYDRODIURIL) 25 MG tablet Take 1 tablet (25 mg total) by mouth daily. 10/16/19   Azzie Glatter, FNP  ibuprofen (ADVIL) 800 MG tablet Take 1 tablet (800 mg total) by mouth every 8 (  eight) hours as needed. 10/16/19   Kallie Locks, FNP  metFORMIN (GLUCOPHAGE) 500 MG tablet Take 1 tablet (500 mg total) by mouth 2 (two) times daily with a meal. 10/16/19   Kallie Locks, FNP  traMADol (ULTRAM) 50 MG tablet TAKE 1 TABLET(50 MG) BY MOUTH EVERY 8 HOURS AS NEEDED FOR SEVERE PAIN Patient not taking: Reported on 11/19/2019 11/17/19   Kallie Locks, FNP  traZODone (DESYREL) 100 MG tablet Take 1 tablet (100 mg total) by mouth at bedtime. 10/16/19   Kallie Locks, FNP    Allergies    Penicillins  Review of Systems   Review of Systems  All other systems reviewed and are negative.   Physical Exam Updated Vital Signs BP (!) 132/91 (BP Location: Right Arm)   Pulse (!) 111   Temp 97.9 F (36.6 C) (Oral)   Resp (!) 28   SpO2 98%   Physical Exam Vitals and nursing note reviewed.  Constitutional:      Appearance: Normal appearance. She is normal weight.  HENT:     Head: Normocephalic.     Right Ear: External ear normal.      Left Ear: External ear normal.     Nose: Nose normal.     Mouth/Throat:     Mouth: Mucous membranes are moist.     Comments: This around teeth estimated to be 14 through 16 with severe caries and only root of tooth present on 15.  There is no surrounding fluctuance noted. Cardiovascular:     Rate and Rhythm: Normal rate.  Pulmonary:     Effort: Pulmonary effort is normal.  Skin:    Capillary Refill: Capillary refill takes less than 2 seconds.  Neurological:     General: No focal deficit present.     Mental Status: She is alert.  Psychiatric:        Mood and Affect: Mood normal.     ED Results / Procedures / Treatments   Labs (all labs ordered are listed, but only abnormal results are displayed) Labs Reviewed - No data to display  EKG None  Radiology No results found.  Procedures Procedures (including critical care time)  Medications Ordered in ED Medications  oxyCODONE-acetaminophen (PERCOCET/ROXICET) 5-325 MG per tablet 2 tablet (has no administration in time range)    ED Course  I have reviewed the triage vital signs and the nursing notes.  Pertinent labs & imaging results that were available during my care of the patient were reviewed by me and considered in my medical decision making (see chart for details).    MDM Rules/Calculators/A&P                           Final Clinical Impression(s) / ED Diagnoses Final diagnoses:  Pain, dental  Dental caries    Rx / DC Orders ED Discharge Orders    None       Margarita Grizzle, MD 05/12/20 [phone]

## 2020-05-12 NOTE — ED Triage Notes (Signed)
Patient here from home reporting left upper dental pain that started 2 days ago. Pain 10/10. OTC meds with no relief.

## 2020-05-29 ENCOUNTER — Encounter (HOSPITAL_BASED_OUTPATIENT_CLINIC_OR_DEPARTMENT_OTHER): Payer: Self-pay | Admitting: Emergency Medicine

## 2020-05-29 ENCOUNTER — Emergency Department (HOSPITAL_BASED_OUTPATIENT_CLINIC_OR_DEPARTMENT_OTHER)
Admission: EM | Admit: 2020-05-29 | Discharge: 2020-05-29 | Disposition: A | Discharge status: Home / Self Care | Payer: BC Managed Care – PPO | Attending: Emergency Medicine | Admitting: Emergency Medicine

## 2020-05-29 ENCOUNTER — Emergency Department (HOSPITAL_BASED_OUTPATIENT_CLINIC_OR_DEPARTMENT_OTHER): Payer: BC Managed Care – PPO

## 2020-05-29 ENCOUNTER — Other Ambulatory Visit: Payer: Self-pay

## 2020-05-29 DIAGNOSIS — Z5321 Procedure and treatment not carried out due to patient leaving prior to being seen by health care provider: Secondary | ICD-10-CM | POA: Diagnosis not present

## 2020-05-29 DIAGNOSIS — M25512 Pain in left shoulder: Secondary | ICD-10-CM | POA: Diagnosis present

## 2020-05-29 NOTE — ED Triage Notes (Signed)
Pt c/o left shoulder pain x 3 days. No known injury

## 2020-05-29 NOTE — ED Notes (Signed)
  Patient eloped before being seen by EDP.  Patient had received x-rays but left without speaking to staff.

## 2020-05-29 NOTE — ED Provider Notes (Signed)
3:06 AM Patient not in room.  Registration staff reports patient eloped about an hour ago.   Naara Kelty, Jonny Ruiz, MD 05/29/20 [phone]

## 2020-07-06 ENCOUNTER — Other Ambulatory Visit: Payer: Self-pay | Admitting: Family Medicine

## 2020-07-29 ENCOUNTER — Emergency Department (HOSPITAL_BASED_OUTPATIENT_CLINIC_OR_DEPARTMENT_OTHER): Payer: Self-pay

## 2020-07-29 ENCOUNTER — Encounter (HOSPITAL_BASED_OUTPATIENT_CLINIC_OR_DEPARTMENT_OTHER): Payer: Self-pay

## 2020-07-29 ENCOUNTER — Emergency Department (HOSPITAL_BASED_OUTPATIENT_CLINIC_OR_DEPARTMENT_OTHER)
Admission: EM | Admit: 2020-07-29 | Discharge: 2020-07-29 | Disposition: A | Discharge status: Home / Self Care | Payer: Self-pay | Attending: Emergency Medicine | Admitting: Emergency Medicine

## 2020-07-29 ENCOUNTER — Other Ambulatory Visit: Payer: Self-pay

## 2020-07-29 DIAGNOSIS — F17210 Nicotine dependence, cigarettes, uncomplicated: Secondary | ICD-10-CM | POA: Insufficient documentation

## 2020-07-29 DIAGNOSIS — Z79899 Other long term (current) drug therapy: Secondary | ICD-10-CM | POA: Insufficient documentation

## 2020-07-29 DIAGNOSIS — N644 Mastodynia: Secondary | ICD-10-CM | POA: Insufficient documentation

## 2020-07-29 DIAGNOSIS — I10 Essential (primary) hypertension: Secondary | ICD-10-CM | POA: Insufficient documentation

## 2020-07-29 DIAGNOSIS — R0789 Other chest pain: Secondary | ICD-10-CM | POA: Insufficient documentation

## 2020-07-29 DIAGNOSIS — R0602 Shortness of breath: Secondary | ICD-10-CM | POA: Insufficient documentation

## 2020-07-29 LAB — CBC WITH DIFFERENTIAL/PLATELET
Abs Immature Granulocytes: 0.02 10*3/uL (ref 0.00–0.07)
Basophils Absolute: 0.1 10*3/uL (ref 0.0–0.1)
Basophils Relative: 1 %
Eosinophils Absolute: 0.3 10*3/uL (ref 0.0–0.5)
Eosinophils Relative: 3 %
HCT: 39.9 % (ref 36.0–46.0)
Hemoglobin: 13.3 g/dL (ref 12.0–15.0)
Immature Granulocytes: 0 %
Lymphocytes Relative: 20 %
Lymphs Abs: 2.1 10*3/uL (ref 0.7–4.0)
MCH: 27.9 pg (ref 26.0–34.0)
MCHC: 33.3 g/dL (ref 30.0–36.0)
MCV: 83.6 fL (ref 80.0–100.0)
Monocytes Absolute: 0.6 10*3/uL (ref 0.1–1.0)
Monocytes Relative: 6 %
Neutro Abs: 7.2 10*3/uL (ref 1.7–7.7)
Neutrophils Relative %: 70 %
Platelets: 287 10*3/uL (ref 150–400)
RBC: 4.77 MIL/uL (ref 3.87–5.11)
RDW: 14.1 % (ref 11.5–15.5)
WBC: 10.3 10*3/uL (ref 4.0–10.5)
nRBC: 0 % (ref 0.0–0.2)

## 2020-07-29 LAB — COMPREHENSIVE METABOLIC PANEL
ALT: 21 U/L (ref 0–44)
AST: 25 U/L (ref 15–41)
Albumin: 4.2 g/dL (ref 3.5–5.0)
Alkaline Phosphatase: 89 U/L (ref 38–126)
Anion gap: 12 (ref 5–15)
BUN: 15 mg/dL (ref 6–20)
CO2: 23 mmol/L (ref 22–32)
Calcium: 9.1 mg/dL (ref 8.9–10.3)
Chloride: 101 mmol/L (ref 98–111)
Creatinine, Ser: 1.2 mg/dL — ABNORMAL HIGH (ref 0.44–1.00)
GFR calc Af Amer: >60 mL/min (ref >60)
GFR calc non Af Amer: 59 mL/min — ABNORMAL LOW (ref >60)
Glucose, Bld: 106 mg/dL — ABNORMAL HIGH (ref 70–99)
Potassium: 4 mmol/L (ref 3.5–5.1)
Sodium: 136 mmol/L (ref 135–145)
Total Bilirubin: 0.5 mg/dL (ref 0.3–1.2)
Total Protein: 8.6 g/dL — ABNORMAL HIGH (ref 6.5–8.1)

## 2020-07-29 LAB — URINALYSIS, ROUTINE W REFLEX MICROSCOPIC
Bilirubin Urine: NEGATIVE
Glucose, UA: NEGATIVE mg/dL
Hgb urine dipstick: NEGATIVE
Ketones, ur: NEGATIVE mg/dL
Leukocytes,Ua: NEGATIVE
Nitrite: NEGATIVE
Protein, ur: NEGATIVE mg/dL
Specific Gravity, Urine: 1.02 (ref 1.005–1.030)
pH: 5.5 (ref 5.0–8.0)

## 2020-07-29 LAB — TROPONIN I (HIGH SENSITIVITY): Troponin I (High Sensitivity): 3 ng/L (ref <18)

## 2020-07-29 LAB — D-DIMER, QUANTITATIVE: D-Dimer, Quant: 0.6 ug/mL-FEU — ABNORMAL HIGH (ref 0.00–0.50)

## 2020-07-29 LAB — PREGNANCY, URINE: Preg Test, Ur: NEGATIVE

## 2020-07-29 LAB — CK: Total CK: 113 U/L (ref 38–234)

## 2020-07-29 MED ORDER — MORPHINE SULFATE (PF) 4 MG/ML IV SOLN
4.0000 mg | Freq: Once | INTRAVENOUS | Status: AC
  Administered 2020-07-29: 4 mg via INTRAVENOUS
  Filled 2020-07-29: qty 1

## 2020-07-29 MED ORDER — SODIUM CHLORIDE 0.9 % IV BOLUS
1000.0000 mL | Freq: Once | INTRAVENOUS | Status: AC
  Administered 2020-07-29: 1000 mL via INTRAVENOUS

## 2020-07-29 MED ORDER — IOHEXOL 350 MG/ML SOLN
100.0000 mL | Freq: Once | INTRAVENOUS | Status: AC | PRN
  Administered 2020-07-29: 100 mL via INTRAVENOUS

## 2020-07-29 NOTE — ED Provider Notes (Signed)
[facility] EMERGENCY DEPARTMENT Provider Note   CSN: [id number] Arrival date & time: 07/29/20  2009     History Chief Complaint  Patient presents with  . Breast Pain    Destiny Cook is a 34 y.o. female hx of HTN, here with right sided breast pain, right chest pain, shortness of breath. Patient states that she has right-sided breast pain that is ongoing for the last several months. She states that it gets worse and then gets better. Patient states that it is worse when she moves her arm. She also has some subjective shortness of breath as well. She denies any recent travel. Patient denies any obvious mass on her breast.  The history is provided by the patient.       Past Medical History:  Diagnosis Date  . Anxiety   . Chronic back pain   . Chronic left hip pain   . Dental caries   . Hypertension   . Insomnia 09/2019    Patient Active Problem List   Diagnosis Date Noted  . Grieving 10/19/2019  . Morbidly obese (HCC) 10/19/2019  . Insomnia 10/19/2019  . Neuropathy 05/02/2019  . Hot flashes 05/02/2019  . Anxiety 03/27/2019  . Shortness of breath 03/27/2019  . Hypertension 03/27/2019  . Chronic back pain 03/27/2019  . Dental caries 03/27/2019  . Class 3 severe obesity due to excess calories with serious comorbidity and body mass index (BMI) of 45.0 to 49.9 in adult [facility]) 03/27/2019    Past Surgical History:  Procedure Laterality Date  . CESAREAN SECTION     x 4  . TUBAL LIGATION       OB History    Gravida  5   Para  4   Term  2   Preterm  2   AB  1   Living  4     SAB  1   TAB  0   Ectopic  0   Multiple  0   Live Births  4        Obstetric Comments  c-sect x 4        Family History  Problem Relation Age of Onset  . Hypertension Mother   . Heart disease Father     Social History   Tobacco Use  . Smoking status: Current Every Day Smoker    Packs/day: 0.50    Years: 15.00    Pack years: 7.50    Types: Cigarettes    . Smokeless tobacco: Never Used  . Tobacco comment: started age 44  Vaping Use  . Vaping Use: Never used  Substance Use Topics  . Alcohol use: No  . Drug use: No    Home Medications Prior to Admission medications   Medication Sig Start Date End Date Taking? Authorizing Provider  busPIRone (BUSPAR) 5 MG tablet Take 1 tablet (5 mg total) by mouth 2 (two) times daily. 11/19/19   Kallie Locks, FNP  cyclobenzaprine (FLEXERIL) 10 MG tablet TAKE 1 TABLET BY MOUTH THREE TIMES DAILY( EVERY EIGHT HOURS) AS NEEDED 07/21/20   Kallie Locks, FNP  gabapentin (NEURONTIN) 100 MG capsule Take 1 capsule (100 mg total) by mouth 2 (two) times daily. 10/16/19   Kallie Locks, FNP  hydrochlorothiazide (HYDRODIURIL) 25 MG tablet Take 1 tablet (25 mg total) by mouth daily. 10/16/19   Kallie Locks, FNP  ibuprofen (ADVIL) 800 MG tablet TAKE 1 TABLET BY MOUTH THREE TIMES DAILY( EVERY EIGHT HOURS) 07/21/20   Raliegh Ip  M, FNP  metFORMIN (GLUCOPHAGE) 500 MG tablet Take 1 tablet (500 mg total) by mouth 2 (two) times daily with a meal. 10/16/19   Kallie Locks, FNP  traZODone (DESYREL) 100 MG tablet Take 1 tablet (100 mg total) by mouth at bedtime. 10/16/19   Kallie Locks, FNP    Allergies    Penicillins  Review of Systems   Review of Systems  Musculoskeletal:       R chest wall vs breast pain   All other systems reviewed and are negative.   Physical Exam Updated Vital Signs BP (!) 147/82   Pulse 100   Temp 98.3 F (36.8 C) (Oral)   Resp 18   Ht 5\' 4"  (1.626 m)   Wt 108.4 kg   LMP 07/15/2020   SpO2 100%   BMI 41.02 kg/m   Physical Exam Vitals and nursing note reviewed.  Constitutional:      Appearance: Normal appearance.  HENT:     Head: Normocephalic.     Nose: Nose normal.     Mouth/Throat:     Mouth: Mucous membranes are moist.  Eyes:     Extraocular Movements: Extraocular movements intact.     Pupils: Pupils are equal, round, and reactive to light.   Cardiovascular:     Rate and Rhythm: Regular rhythm. Tachycardia present.     Pulses: Normal pulses.     Heart sounds: Normal heart sounds.  Pulmonary:     Effort: Pulmonary effort is normal.     Breath sounds: Normal breath sounds.     Comments: Breast exam done with chaperone- no obvious breast mass and no obvious nipple discharge right breast  Abdominal:     General: Abdomen is flat.     Palpations: Abdomen is soft.  Musculoskeletal:        General: Normal range of motion.     Cervical back: Normal range of motion.  Skin:    General: Skin is warm.     Capillary Refill: Capillary refill takes less than 2 seconds.  Neurological:     General: No focal deficit present.     Mental Status: She is alert and oriented to person, place, and time. Mental status is at baseline.  Psychiatric:        Mood and Affect: Mood normal.        Behavior: Behavior normal.     ED Results / Procedures / Treatments   Labs (all labs ordered are listed, but only abnormal results are displayed) Labs Reviewed  COMPREHENSIVE METABOLIC PANEL - Abnormal; Notable for the following components:      Result Value   Glucose, Bld 106 (*)    Creatinine, Ser 1.20 (*)    Total Protein 8.6 (*)    GFR calc non Af Amer 59 (*)    All other components within normal limits  D-DIMER, QUANTITATIVE (NOT AT [facility]) - Abnormal; Notable for the following components:   D-Dimer, Quant 0.60 (*)    All other components within normal limits  CBC WITH DIFFERENTIAL/PLATELET  CK  PREGNANCY, URINE  TROPONIN I (HIGH SENSITIVITY)    EKG EKG Interpretation  Date/Time:  Wednesday July 29 2020 21:48:20 EDT Ventricular Rate:  94 PR Interval:  96 QRS Duration: 86 QT Interval:  336 QTC Calculation: 420 R Axis:   71 Text Interpretation: Sinus rhythm with short PR Otherwise normal ECG No significant change since last tracing Confirmed by 05-06-2004 [phone]) on 07/29/2020 10:01:35 PM   Radiology DG  Chest 2 View  Result  Date: 07/29/2020 CLINICAL DATA:  Chest pain. EXAM: CHEST - 2 VIEW COMPARISON:  Radiograph and chest CTA 03/26/2019 FINDINGS: The cardiomediastinal contours are normal. The lungs are clear. Pulmonary vasculature is normal. No consolidation, pleural effusion, or pneumothorax. No acute osseous abnormalities are seen. EKG leads overlie the chest. IMPRESSION: Negative radiographs of the chest. Electronically Signed   By: Narda Rutherford M.D.   On: 07/29/2020 22:58    Procedures Procedures (including critical care time)  Angiocath insertion Performed by: Richardean Canal  Consent: Verbal consent obtained. Risks and benefits: risks, benefits and alternatives were discussed Time out: Immediately prior to procedure a "time out" was called to verify the correct patient, procedure, equipment, support staff and site/side marked as required.  Preparation: Patient was prepped and draped in the usual sterile fashion.  Vein Location: L antecube  Ultrasound Guided  Gauge: 20 long   Normal blood return and flush without difficulty Patient tolerance: Patient tolerated the procedure well with no immediate complications.     Medications Ordered in ED Medications  iohexol (OMNIPAQUE) 350 MG/ML injection 100 mL (has no administration in time range)  sodium chloride 0.9 % bolus 1,000 mL (1,000 mLs Intravenous New Bag/Given 07/29/20 2231)  morphine 4 MG/ML injection 4 mg (4 mg Intravenous Given 07/29/20 2230)    ED Course  I have reviewed the triage vital signs and the nursing notes.  Pertinent labs & imaging results that were available during my care of the patient were reviewed by me and considered in my medical decision making (see chart for details).    MDM Rules/Calculators/A&P                          Francheska Villeda is a 34 y.o. female here with R breast pain, SOB. I am unable to palpate any obvious breast mass on exam. She seems to have diffuse chest wall tenderness. She is also tachycardic.  Consider rhabdo vs muscle strain vs PE. Will get labs, d-dimer, CXR.   11:25 PM D-dimer elevated.CTA chest showed no PE. Stable for discharge. Will have her follow up at Breast Clinic   Final Clinical Impression(s) / ED Diagnoses Final diagnoses:  None    Rx / DC Orders ED Discharge Orders    None       Charlynne Pander, MD 07/29/20 2340

## 2020-07-29 NOTE — ED Notes (Signed)
Assisted MD with breast exam. Pt tolerated weill.

## 2020-07-29 NOTE — Discharge Instructions (Addendum)
Please follow up with Breast clinic for mammogram and further imaging studies if you have persistent pain   Take tylenol, motrin for pain   See your doctor   Return to ER if you have worse chest pain, breast pain, trouble breathing

## 2020-07-29 NOTE — ED Triage Notes (Signed)
Pt c/o pain to right breast and right UE x "months" denies injury to either site-pain worse with movment-NAD-steady gait

## 2020-07-29 NOTE — ED Notes (Signed)
Patient transported to CT 

## 2020-09-11 ENCOUNTER — Ambulatory Visit: Payer: Self-pay | Admitting: Family Medicine

## 2020-10-26 ENCOUNTER — Encounter: Payer: Self-pay | Admitting: Family Medicine

## 2020-10-26 ENCOUNTER — Ambulatory Visit (INDEPENDENT_AMBULATORY_CARE_PROVIDER_SITE_OTHER): Payer: Self-pay | Admitting: Family Medicine

## 2020-10-26 ENCOUNTER — Other Ambulatory Visit: Payer: Self-pay

## 2020-10-26 VITALS — BP 119/81 | HR 92 | Temp 98.2°F | Resp 20 | Ht 63.0 in | Wt 246.8 lb

## 2020-10-26 DIAGNOSIS — Z09 Encounter for follow-up examination after completed treatment for conditions other than malignant neoplasm: Secondary | ICD-10-CM

## 2020-10-26 DIAGNOSIS — F419 Anxiety disorder, unspecified: Secondary | ICD-10-CM

## 2020-10-26 DIAGNOSIS — N644 Mastodynia: Secondary | ICD-10-CM

## 2020-10-26 DIAGNOSIS — N6310 Unspecified lump in the right breast, unspecified quadrant: Secondary | ICD-10-CM

## 2020-10-26 NOTE — Progress Notes (Signed)
Bad headache w/ ringing in ears to R side x 4 days- out of BP med  Sharp pain to R breast/shoulder x 1 month   Out of all prescribed meds

## 2020-10-26 NOTE — Progress Notes (Signed)
[facility]   Sick Visit  Subjective:  Patient ID: Destiny Cook, female    DOB: 1986/10/03  Age: 34 y.o. MRN: [id number]  CC:  Chief Complaint  Patient presents with  . Headache    R side of head    HPI Destiny Cook is a 34 year old female who presents for Sick Visit today.    Patient Active Problem List   Diagnosis Date Noted  . Grieving 10/19/2019  . Morbidly obese (HCC) 10/19/2019  . Insomnia 10/19/2019  . Neuropathy 05/02/2019  . Hot flashes 05/02/2019  . Anxiety 03/27/2019  . Shortness of breath 03/27/2019  . Hypertension 03/27/2019  . Chronic back pain 03/27/2019  . Dental caries 03/27/2019  . Class 3 severe obesity due to excess calories with serious comorbidity and body mass index (BMI) of 45.0 to 49.9 in adult [facility]) 03/27/2019   Current Status: Since her last office visit, she has c/o right chest/breast pain which radiates to her right shoulder X 3 months. She describes right breast tender to touch or when she lays on her right side.  She has had ED visit which was r/o for any abnormalities. She denies fevers, chills, fatigue, recent infections, weight loss, and night sweats. She has not had any headaches, visual changes, dizziness, and falls. No chest pain, heart palpitations, cough and shortness of breath reported. Denies GI problems such as nausea, vomiting, diarrhea, and constipation. She has no reports of blood in stools, dysuria and hematuria. No depression or anxiety reported today. She is taking all medications as prescribed.     Past Medical History:  Diagnosis Date  . Anxiety   . Chronic back pain   . Chronic left hip pain   . Dental caries   . Hypertension   . Insomnia 09/2019    Past Surgical History:  Procedure Laterality Date  . CESAREAN SECTION     x 4  . TUBAL LIGATION      Family History  Problem Relation Age of Onset  . Hypertension Mother   . Heart disease Father     Social  History   Socioeconomic History  . Marital status: Single    Spouse name: Not on file  . Number of children: Not on file  . Years of education: Not on file  . Highest education level: Not on file  Occupational History  . Not on file  Tobacco Use  . Smoking status: Current Every Day Smoker    Packs/day: 0.50    Years: 15.00    Pack years: 7.50    Types: Cigarettes  . Smokeless tobacco: Never Used  . Tobacco comment: started age 22  Vaping Use  . Vaping Use: Never used  Substance and Sexual Activity  . Alcohol use: No  . Drug use: No  . Sexual activity: Not on file  Other Topics Concern  . Not on file  Social History Narrative  . Not on file   Social Determinants of Health   Financial Resource Strain: Not on file  Food Insecurity: Not on file  Transportation Needs: Not on file  Physical Activity: Not on file  Stress: Not on file  Social Connections: Not on file  Intimate Partner Violence: Not on file    Outpatient Medications Prior to Visit  Medication Sig Dispense Refill  . busPIRone (BUSPAR) 5 MG tablet Take 1 tablet (5 mg total) by mouth 2 (two) times daily. (Patient not taking: Reported on 10/26/2020) 60  tablet 3  . cyclobenzaprine (FLEXERIL) 10 MG tablet TAKE 1 TABLET BY MOUTH THREE TIMES DAILY( EVERY EIGHT HOURS) AS NEEDED (Patient not taking: Reported on 10/26/2020) 30 tablet 6  . gabapentin (NEURONTIN) 100 MG capsule Take 1 capsule (100 mg total) by mouth 2 (two) times daily. (Patient not taking: Reported on 10/26/2020) 60 capsule 3  . hydrochlorothiazide (HYDRODIURIL) 25 MG tablet Take 1 tablet (25 mg total) by mouth daily. (Patient not taking: Reported on 10/26/2020) 30 tablet 3  . ibuprofen (ADVIL) 800 MG tablet TAKE 1 TABLET BY MOUTH THREE TIMES DAILY( EVERY EIGHT HOURS) (Patient not taking: Reported on 10/26/2020) 30 tablet 6  . metFORMIN (GLUCOPHAGE) 500 MG tablet Take 1 tablet (500 mg total) by mouth 2 (two) times daily with a meal. (Patient not taking: Reported  on 10/26/2020) 60 tablet 3  . traZODone (DESYREL) 100 MG tablet Take 1 tablet (100 mg total) by mouth at bedtime. (Patient not taking: Reported on 10/26/2020) 30 tablet 3   No facility-administered medications prior to visit.    Allergies  Allergen Reactions  . Penicillins Itching and Rash    ROS Review of Systems  Constitutional: Negative.   Respiratory: Negative.   Cardiovascular:       Chest discomfort/pain; right breast mass, tenderness.  Gastrointestinal: Positive for abdominal distention (obese).  Neurological: Negative.   Psychiatric/Behavioral: Negative.    Objective:    Physical Exam Vitals and nursing note reviewed.  Constitutional:      Appearance: Normal appearance. She is well-developed.  Cardiovascular:     Rate and Rhythm: Normal rate and regular rhythm.     Heart sounds: Normal heart sounds.  Pulmonary:     Effort: Pulmonary effort is normal.     Breath sounds: Normal breath sounds.  Abdominal:     General: There is distension (obese).     Palpations: Abdomen is soft.  Musculoskeletal:        General: Normal range of motion.  Skin:    General: Skin is warm and dry.  Neurological:     General: No focal deficit present.     Mental Status: She is alert and oriented to person, place, and time.  Psychiatric:        Mood and Affect: Mood normal.        Behavior: Behavior normal.        Thought Content: Thought content normal.        Judgment: Judgment normal.    BP 119/81 (BP Location: Right Arm, Patient Position: Sitting, Cuff Size: Large)   Pulse 92   Temp 98.2 F (36.8 C) (Temporal)   Resp 20   Ht 5\' 3"  (1.6 m)   Wt 246 lb 12.8 oz (111.9 kg)   SpO2 100%   BMI 43.72 kg/m  Wt Readings from Last 3 Encounters:  10/26/20 246 lb 12.8 oz (111.9 kg)  07/29/20 239 lb (108.4 kg)  05/29/20 240 lb (108.9 kg)     Health Maintenance Due  Topic Date Due  . Hepatitis C Screening  Never done  . PAP SMEAR-Modifier  Never done    There are no  preventive care reminders to display for this patient.  No results found for: TSH Lab Results  Component Value Date   WBC 10.3 07/29/2020   HGB 13.3 07/29/2020   HCT 39.9 07/29/2020   MCV 83.6 07/29/2020   PLT 287 07/29/2020   Lab Results  Component Value Date   NA 136 07/29/2020   K 4.0 07/29/2020  CO2 23 07/29/2020   GLUCOSE 106 (H) 07/29/2020   BUN 15 07/29/2020   CREATININE 1.20 (H) 07/29/2020   BILITOT 0.5 07/29/2020   ALKPHOS 89 07/29/2020   AST 25 07/29/2020   ALT 21 07/29/2020   PROT 8.6 (H) 07/29/2020   ALBUMIN 4.2 07/29/2020   CALCIUM 9.1 07/29/2020   ANIONGAP 12 07/29/2020   No results found for: CHOL No results found for: HDL No results found for: LDLCALC No results found for: TRIG No results found for: [facility] Lab Results  Component Value Date   HGBA1C 5.3 10/16/2019      Assessment & Plan:   1. Breast mass, right - MM 3D SCREEN BREAST BILATERAL; Future  2. Breast pain, right - MM 3D SCREEN BREAST BILATERAL; Future  3. Anxiety  4. Follow up She will follow up in 3 months.   No orders of the defined types were placed in this encounter.   Orders Placed This Encounter  Procedures  . MM 3D SCREEN BREAST BILATERAL    Referral Orders  No referral(s) requested today    Raliegh Ip, MSN, ANE, FNP-BC [facility] [address] [place], [place] [address] [phone] fax   Problem List Items Addressed This Visit      Other   Anxiety    Other Visit Diagnoses    Breast mass, right    -  Primary   Relevant Orders   MM 3D SCREEN BREAST BILATERAL   Breast pain, right       Relevant Orders   MM 3D SCREEN BREAST BILATERAL   Follow up          No orders of the defined types were placed in this encounter.   Follow-up: Return in about 3 months (around 01/24/2021).    Kallie Locks, FNP

## 2020-10-29 ENCOUNTER — Encounter: Payer: Self-pay | Admitting: Family Medicine

## 2021-01-25 ENCOUNTER — Ambulatory Visit: Payer: Self-pay | Admitting: Family Medicine

## 2021-01-25 NOTE — Progress Notes (Deleted)
Patient no-showed today's appointment; provider notified for review of record.

## 2021-04-22 ENCOUNTER — Other Ambulatory Visit: Payer: Self-pay

## 2021-04-22 ENCOUNTER — Encounter (HOSPITAL_BASED_OUTPATIENT_CLINIC_OR_DEPARTMENT_OTHER): Payer: Self-pay | Admitting: *Deleted

## 2021-04-22 DIAGNOSIS — M79605 Pain in left leg: Secondary | ICD-10-CM | POA: Insufficient documentation

## 2021-04-22 DIAGNOSIS — M545 Low back pain, unspecified: Secondary | ICD-10-CM | POA: Insufficient documentation

## 2021-04-22 DIAGNOSIS — Z5321 Procedure and treatment not carried out due to patient leaving prior to being seen by health care provider: Secondary | ICD-10-CM | POA: Diagnosis not present

## 2021-04-22 NOTE — ED Triage Notes (Signed)
C/o left lower back pain which radiates down left leg x 1 week

## 2021-04-23 ENCOUNTER — Emergency Department (HOSPITAL_BASED_OUTPATIENT_CLINIC_OR_DEPARTMENT_OTHER)
Admission: EM | Admit: 2021-04-23 | Discharge: 2021-04-23 | Disposition: A | Discharge status: Home / Self Care | Payer: BC Managed Care – PPO | Attending: Emergency Medicine | Admitting: Emergency Medicine

## 2021-04-23 ENCOUNTER — Ambulatory Visit
Admission: EM | Admit: 2021-04-23 | Discharge: 2021-04-23 | Disposition: A | Discharge status: Home / Self Care | Payer: BC Managed Care – PPO | Attending: Student | Admitting: Student

## 2021-04-23 DIAGNOSIS — M5432 Sciatica, left side: Secondary | ICD-10-CM

## 2021-04-23 DIAGNOSIS — M545 Low back pain, unspecified: Secondary | ICD-10-CM

## 2021-04-23 DIAGNOSIS — M5442 Lumbago with sciatica, left side: Secondary | ICD-10-CM | POA: Diagnosis not present

## 2021-04-23 DIAGNOSIS — R7303 Prediabetes: Secondary | ICD-10-CM

## 2021-04-23 DIAGNOSIS — G8929 Other chronic pain: Secondary | ICD-10-CM

## 2021-04-23 MED ORDER — KETOROLAC TROMETHAMINE 30 MG/ML IJ SOLN
30.0000 mg | Freq: Once | INTRAMUSCULAR | Status: AC
  Administered 2021-04-23: 30 mg via INTRAMUSCULAR

## 2021-04-23 MED ORDER — TIZANIDINE HCL 2 MG PO TABS: 2.0000 mg | ORAL_TABLET | Freq: Three times a day (TID) | ORAL | 0 refills | Status: AC | PRN

## 2021-04-23 MED ORDER — PREDNISONE 20 MG PO TABS: 40.0000 mg | ORAL_TABLET | Freq: Every day | ORAL | 0 refills | Status: AC

## 2021-04-23 MED ORDER — ACETAMINOPHEN 500 MG PO TABS: 1000.0000 mg | ORAL_TABLET | Freq: Three times a day (TID) | ORAL | 0 refills | Status: DC | PRN

## 2021-04-23 NOTE — ED Triage Notes (Addendum)
Patient presents to Urgent Care with complaints of lower back pain that radiates to left lower leg x 2 days ago. Pt states pain has worsened. Pt has a hx of chronic hip pain. Treating pain with ibuprofen and tylenol.   Denies fever.

## 2021-04-23 NOTE — Discharge Instructions (Addendum)
-  Prednisone, 2 pills taken at the same time for 5 days in a row.  Try taking this earlier in the day as it can give you energy.  -Start the muscle relaxer-Zanaflex (tizanidine), up to 3 times daily for muscle spasms and pain.  This can make you drowsy, so take at bedtime or when you do not need to drive or operate machinery. -Tylenol 1000mg  up to 3x daily.  Try to avoid ibuprofen while taking prednisone as this can increase your chance of gastritis and gastrointestinal bleeding. -You can also try heating pad or warm bath to help relax the muscles. -Seek additional medical attention if you develop new symptoms like numbness in the inside of your thighs, trouble going to the bathroom like urinary retention or constipation, the worst back pain of your life, etc.

## 2021-04-23 NOTE — ED Provider Notes (Signed)
[facility]    CSN: [id number] Arrival date & time: 04/23/21  0857      History   Chief Complaint Chief Complaint  Patient presents with  . Back Pain  . Leg Pain    Left leg   . Hip Pain    HPI Destiny Cook is a 35 y.o. female presenting with L back pain, leg pain, hip pain.  History of chronic left hip pain and chronic back pain.  Also with history of morbid obesity and prediabetes.  Notes 2 days of left-sided lumbar paraspinous muscle tenderness with pain shooting down the back and outer aspect of her left leg.  Some numbness and tingling in her left foot.  Denies trauma but states she is very active at work with lots of walking.  She tried ibuprofen but this did not provide any relief and so she has not taken this in over 2 days now.  States she cannot be pregnant. Denies numbness in arms/legs, denies weakness in arms/legs, denies saddle anesthesia, denies bowel/bladder incontinence, denies urinary incontinence and constipation. Taking metformin as directed for prediabetes.  HPI  Past Medical History:  Diagnosis Date  . Anxiety   . Chronic back pain   . Chronic left hip pain   . Dental caries   . Hypertension   . Insomnia 09/2019    Patient Active Problem List   Diagnosis Date Noted  . Grieving 10/19/2019  . Morbidly obese (HCC) 10/19/2019  . Insomnia 10/19/2019  . Neuropathy 05/02/2019  . Hot flashes 05/02/2019  . Anxiety 03/27/2019  . Shortness of breath 03/27/2019  . Hypertension 03/27/2019  . Chronic back pain 03/27/2019  . Dental caries 03/27/2019  . Class 3 severe obesity due to excess calories with serious comorbidity and body mass index (BMI) of 45.0 to 49.9 in adult [facility](HCC) 03/27/2019    Past Surgical History:  Procedure Laterality Date  . CESAREAN SECTION     x 4  . TUBAL LIGATION      OB History    Gravida  5   Para  4   Term  2   Preterm  2   AB  1   Living  4     SAB  1   IAB  0   Ectopic  0   Multiple  0    Live Births  4        Obstetric Comments  c-sect x 4         Home Medications    Prior to Admission medications   Medication Sig Start Date End Date Taking? Authorizing Provider  acetaminophen (TYLENOL) 500 MG tablet Take 2 tablets (1,000 mg total) by mouth every 8 (eight) hours as needed. 04/23/21  Yes Rhys MartiniGraham, Marlet Korte E, PA-C  predniSONE (DELTASONE) 20 MG tablet Take 2 tablets (40 mg total) by mouth daily for 5 days. 04/23/21 04/28/21 Yes Rhys MartiniGraham, Aadya Kindler E, PA-C  tiZANidine (ZANAFLEX) 2 MG tablet Take 1 tablet (2 mg total) by mouth every 8 (eight) hours as needed for muscle spasms. 04/23/21  Yes Rhys MartiniGraham, Annai Heick E, PA-C  gabapentin (NEURONTIN) 100 MG capsule Take 1 capsule (100 mg total) by mouth 2 (two) times daily. Patient not taking: Reported on 10/26/2020 10/16/19 04/23/21  Kallie LocksStroud, Natalie M, FNP  hydrochlorothiazide (HYDRODIURIL) 25 MG tablet Take 1 tablet (25 mg total) by mouth daily. Patient not taking: Reported on 10/26/2020 10/16/19 04/23/21  Kallie LocksStroud, Natalie M, FNP  metFORMIN (GLUCOPHAGE) 500 MG tablet Take 1 tablet (500 mg total)  by mouth 2 (two) times daily with a meal. Patient not taking: Reported on 10/26/2020 10/16/19 04/23/21  Kallie Locks, FNP  traZODone (DESYREL) 100 MG tablet Take 1 tablet (100 mg total) by mouth at bedtime. Patient not taking: Reported on 10/26/2020 10/16/19 04/23/21  Kallie Locks, FNP    Family History Family History  Problem Relation Age of Onset  . Hypertension Mother   . Heart disease Father     Social History Social History   Tobacco Use  . Smoking status: Current Every Day Smoker    Packs/day: 0.50    Years: 15.00    Pack years: 7.50    Types: Cigarettes  . Smokeless tobacco: Never Used  . Tobacco comment: started age 8  Vaping Use  . Vaping Use: Never used  Substance Use Topics  . Alcohol use: No  . Drug use: No     Allergies   Penicillins   Review of Systems Review of Systems  Constitutional: Negative for chills, fever and  unexpected weight change.  Respiratory: Negative for chest tightness and shortness of breath.   Cardiovascular: Negative for chest pain and palpitations.  Gastrointestinal: Negative for abdominal pain, diarrhea, nausea and vomiting.  Genitourinary: Negative for decreased urine volume, difficulty urinating and frequency.  Musculoskeletal: Positive for back pain. Negative for arthralgias, gait problem, joint swelling, myalgias, neck pain and neck stiffness.  Skin: Negative for wound.  Neurological: Negative for dizziness, tremors, seizures, syncope, facial asymmetry, speech difficulty, weakness, light-headedness, numbness and headaches.  All other systems reviewed and are negative.    Physical Exam Triage Vital Signs ED Triage Vitals  Enc Vitals Group     BP 04/23/21 1050 116/75     Pulse Rate 04/23/21 1050 94     Resp 04/23/21 1050 18     Temp 04/23/21 1050 98.1 F (36.7 C)     Temp Source 04/23/21 1050 Oral     SpO2 04/23/21 1050 98 %     Weight --      Height --      Head Circumference --      Peak Flow --      Pain Score 04/23/21 1048 10     Pain Loc --      Pain Edu? --      Excl. in GC? --    No data found.  Updated Vital Signs BP 116/75 (BP Location: Left Arm)   Pulse 94   Temp 98.1 F (36.7 C) (Oral)   Resp 18   LMP 04/15/2021   SpO2 98%   Visual Acuity Right Eye Distance:   Left Eye Distance:   Bilateral Distance:    Right Eye Near:   Left Eye Near:    Bilateral Near:     Physical Exam Vitals reviewed.  Constitutional:      General: She is not in acute distress.    Appearance: Normal appearance. She is not ill-appearing.  HENT:     Head: Normocephalic and atraumatic.  Eyes:     Extraocular Movements: Extraocular movements intact.     Pupils: Pupils are equal, round, and reactive to light.  Cardiovascular:     Rate and Rhythm: Normal rate and regular rhythm.     Heart sounds: Normal heart sounds.  Pulmonary:     Effort: Pulmonary effort is  normal.     Breath sounds: Normal breath sounds and air entry.  Abdominal:     Tenderness: There is no abdominal tenderness. There is no right  CVA tenderness, left CVA tenderness, guarding or rebound.     Comments: No bowel or bladder incontinence.  Musculoskeletal:     Cervical back: Normal range of motion. No swelling, deformity, signs of trauma, rigidity, spasms, tenderness, bony tenderness or crepitus. No pain with movement.     Thoracic back: No swelling, deformity, signs of trauma, spasms, tenderness or bony tenderness. Normal range of motion. No scoliosis.     Lumbar back: Spasms and tenderness present. No swelling, deformity, signs of trauma or bony tenderness. Normal range of motion. Positive left straight leg raise test. Negative right straight leg raise test. No scoliosis.     Comments: Left-sided lumbar paraspinous muscle tenderness to palpation.  Positive left straight leg raise.  No spinous deformity, tenderness, step-off.  No cervical or thoracic paraspinous muscle tenderness.  Gait intact but with pain.  Strength 5 out of 5 in upper and lower extremities, sensation intact.  No saddle anesthesia.  DP 2+.  Absolutely no other injury, deformity, tenderness, ecchymosis, abrasion.  Neurological:     General: No focal deficit present.     Mental Status: She is alert.     Cranial Nerves: No cranial nerve deficit.  Psychiatric:        Mood and Affect: Mood normal. Affect is tearful.        Behavior: Behavior normal.        Thought Content: Thought content normal.        Judgment: Judgment normal.      UC Treatments / Results  Labs (all labs ordered are listed, but only abnormal results are displayed) Labs Reviewed - No data to display  EKG   Radiology No results found.  Procedures Procedures (including critical care time)  Medications Ordered in UC Medications  ketorolac (TORADOL) 30 MG/ML injection 30 mg (has no administration in time range)    Initial Impression  / Assessment and Plan / UC Course  I have reviewed the triage vital signs and the nursing notes.  Pertinent labs & imaging results that were available during my care of the patient were reviewed by me and considered in my medical decision making (see chart for details).     This patient is a 35 year old female with acute exacerbation of her chronic back pain and sciatica. No red flag symptoms.  Patient with history of prediabetes but glucose fairly normal last CMP. Continue metformin.  Toradol administered today. States she could not be pregnant. Has not taken ibuprofen in >2 days. Prednisone, zanaflex, tylenol as below.  Work note provided.   ED return precautions discussed.   Final Clinical Impressions(s) / UC Diagnoses   Final diagnoses:  Left sciatic nerve pain  Chronic left-sided low back pain with left-sided sciatica  Acute exacerbation of chronic low back pain  Prediabetes     Discharge Instructions     -Prednisone, 2 pills taken at the same time for 5 days in a row.  Try taking this earlier in the day as it can give you energy.  -Start the muscle relaxer-Zanaflex (tizanidine), up to 3 times daily for muscle spasms and pain.  This can make you drowsy, so take at bedtime or when you do not need to drive or operate machinery. -Tylenol 1000mg  up to 3x daily.  Try to avoid ibuprofen while taking prednisone as this can increase your chance of gastritis and gastrointestinal bleeding. -You can also try heating pad or warm bath to help relax the muscles. -Seek additional medical attention if you develop new  symptoms like numbness in the inside of your thighs, trouble going to the bathroom like urinary retention or constipation, the worst back pain of your life, etc.    ED Prescriptions    Medication Sig Dispense Auth. Provider   tiZANidine (ZANAFLEX) 2 MG tablet Take 1 tablet (2 mg total) by mouth every 8 (eight) hours as needed for muscle spasms. 21 tablet Rhys Martini, PA-C    predniSONE (DELTASONE) 20 MG tablet Take 2 tablets (40 mg total) by mouth daily for 5 days. 10 tablet Rhys Martini, PA-C   acetaminophen (TYLENOL) 500 MG tablet Take 2 tablets (1,000 mg total) by mouth every 8 (eight) hours as needed. 30 tablet Rhys Martini, PA-C     PDMP not reviewed this encounter.   Rhys Martini, PA-C 04/23/21 1122

## 2021-08-12 ENCOUNTER — Ambulatory Visit: Payer: BC Managed Care – PPO | Admitting: Podiatry

## 2021-08-17 ENCOUNTER — Emergency Department (HOSPITAL_COMMUNITY)
Admission: EM | Admit: 2021-08-17 | Discharge: 2021-08-18 | Disposition: A | Discharge status: Home / Self Care | Payer: BC Managed Care – PPO | Attending: Emergency Medicine | Admitting: Emergency Medicine

## 2021-08-17 ENCOUNTER — Other Ambulatory Visit: Payer: Self-pay

## 2021-08-17 ENCOUNTER — Emergency Department (HOSPITAL_COMMUNITY): Payer: BC Managed Care – PPO

## 2021-08-17 DIAGNOSIS — I10 Essential (primary) hypertension: Secondary | ICD-10-CM | POA: Insufficient documentation

## 2021-08-17 DIAGNOSIS — M7732 Calcaneal spur, left foot: Secondary | ICD-10-CM | POA: Diagnosis not present

## 2021-08-17 DIAGNOSIS — Z7984 Long term (current) use of oral hypoglycemic drugs: Secondary | ICD-10-CM | POA: Insufficient documentation

## 2021-08-17 DIAGNOSIS — Z79899 Other long term (current) drug therapy: Secondary | ICD-10-CM | POA: Insufficient documentation

## 2021-08-17 DIAGNOSIS — F17210 Nicotine dependence, cigarettes, uncomplicated: Secondary | ICD-10-CM | POA: Diagnosis not present

## 2021-08-17 DIAGNOSIS — M79605 Pain in left leg: Secondary | ICD-10-CM | POA: Diagnosis not present

## 2021-08-17 DIAGNOSIS — M79672 Pain in left foot: Secondary | ICD-10-CM | POA: Diagnosis present

## 2021-08-17 MED ORDER — IBUPROFEN 400 MG PO TABS
600.0000 mg | ORAL_TABLET | Freq: Once | ORAL | Status: AC
  Administered 2021-08-17: 600 mg via ORAL
  Filled 2021-08-17: qty 1

## 2021-08-17 NOTE — ED Provider Notes (Signed)
Emergency Medicine Provider Triage Evaluation Note  Destiny Cook , a 35 y.o. female  was evaluated in triage.  Pt complains of left foot pain.  She says she has had this left foot pain for about 3 weeks now.  It is localized to her left heel.  Is worse when she is moving around and walking.  She is describes it as aching and burning.  She denies any numbness and tingling in her toes.  She has associated left lower back pain with sciatica that goes down her knee for about a month and a half now.  During that these 2 things were related.  She denies any saddle anesthesia, bowel or bladder incontinence or retention.  She is not being seen a primary care provider currently.  She is not treating her back pain with anything.  Review of Systems  Positive: Foot pain, lower back pain, sciatica Negative: Numbness or tingling, bowel bladder incontinence, urinary retention, saddle anesthesia  Physical Exam  BP (!) 118/95 (BP Location: Right Arm)   Pulse 85   Temp 98.5 F (36.9 C) (Oral)   Resp 20   Ht 5\' 3"  (1.6 m)   Wt 99.8 kg   SpO2 98%   BMI 38.97 kg/m  Gen:   Awake, no distress   Resp:  Normal effort  MSK:   Moves extremities without difficulty  Other:  No lumbar midline tenderness.  Pedal pulses 2+ bilaterally.  Full range of motion of left ankle.  Notable swelling of left foot.  No redness or any cuts to the area.  Medical Decision Making  Medically screening exam initiated at 9:33 PM.  Appropriate orders placed.  Santita Hunsberger was informed that the remainder of the evaluation will be completed by another provider, this initial triage assessment does not replace that evaluation, and the importance of remaining in the ED until their evaluation is complete.   Abagail Kitchens 08/17/21 2136    2137, DO 08/17/21 2149

## 2021-08-17 NOTE — ED Triage Notes (Signed)
Pt c/o left foot pain that radiated up leg. Denies any injury. "Feels like something is sticking me" NO obvious injury on assessment. No arm numbness. Pt ambulatory in triage.

## 2021-08-18 MED ORDER — KETOROLAC TROMETHAMINE 30 MG/ML IJ SOLN
15.0000 mg | Freq: Once | INTRAMUSCULAR | Status: AC
  Administered 2021-08-18: 15 mg via INTRAMUSCULAR
  Filled 2021-08-18: qty 1

## 2021-08-18 NOTE — Discharge Instructions (Addendum)
Thank you for allowing me to care for you today in the Emergency Department.   You were seen today for left heel pain.  You are found to have a calcaneal bone spur to your left foot, which is causing your pain.  Please follow-up with podiatry.  I would recommend calling their office in the morning and let them know that you are following up from the emergency department.  I have included additional information below on bone spurs.  Take 650 mg of Tylenol or 600 mg of ibuprofen with food every 6 hours for pain.  You can alternate between these 2 medications every 3 hours if your pain returns.  For instance, you can take Tylenol at noon, followed by a dose of ibuprofen at 3, followed by second dose of Tylenol and 6.  Elevate your leg when you are at home so that you are sitting or resting so that your toes are at or above the level of your nose.  Apply an ice pack for 15 to 20 minutes up to 3-4 times a day help with your symptoms.  Gently stretch the muscles of the foot, with pain.  You can try getting a shoe inserts that supports the arch in your foot until you can be seen by podiatry.  They may recommend a more specific insert for your shoe.  Although I would not expect the symptoms, you should return to the emergency department if you start having fevers, chills, if the heel of your foot becomes red, hot to the touch, swollen, if it starts having thick, mucus-like drainage, red streaking up the leg, or other new, concerning symptoms.  What causes heel spurs? Heel spurs happen when:  Foot muscles and ligaments are strained Plantar fascia -- fibrous tissue along the bottom of your foot that connects heel to toes -- is stretched Membranes that cover the heel bone tear Repeated foot pounding, common among professional athletes, causes calcium deposits to build up on the bottom of your heel bone, which forms the protrusion that causes inflammation. But you don't have to be a track star to develop  heel spurs. The condition also is created by:  Gait abnormalities that stress the feet. Jogging or running on hard surfaces. Poorly fitting shoes. Obesity or excess weight. Standing a lot on your feet. Flat feet or high arches. Increasing age and diabetes. Heel spur symptoms Heel spurs become a problem when they cause pain, which patients compare to a pin or knife sticking into the bottom of their feet when they stand each morning. The pain morphs into a dull ache as the day wears on. Pain also occurs when you stand after sitting for a long time.  Heel spur treatments Treatments run from the mechanical to the surgical. Luckily, 90% of heel spur sufferers get better without surgery.  Here are common, nonsurgical heel spur treatments.  Ice packs after walking and exercise Over-the-counter anti-inflammatory medications such as acetaminophen, ibuprofen, or aspirin Injections of anti-inflammatory medications such as cortisone Stretching exercises, especially before bed Physical therapy Resting your feet Orthotic shoe inserts that provide arch support

## 2021-08-18 NOTE — ED Provider Notes (Signed)
[facility] EMERGENCY DEPARTMENT Provider Note   CSN: [id number] Arrival date & time: 08/17/21  2104     History Chief Complaint  Patient presents with   Leg Pain    left    Destiny Cook is a 35 y.o. female with a history of anxiety, chronic low back pain, hypertension who presents to the emergency department with a chief complaint of left foot pain.  The patient reports that she has a history of pain in her low back that will shoot down her leg into her foot.  However, for the last 3 weeks she has been having pain that ended at the heel of her left foot.  She has not had any low back pain or radiating pain into the foot.  Pain is worse with standing, weightbearing, walking.  She characterizes the pain as aching and burning.  It is nonradiating.  She denies numbness, weakness, left ankle pain, redness, swelling, fever, chills.  She has been taking 800 mg ibuprofen without improvement in her symptoms.  She works for long hours and [employer].  Denies any recent falls or injuries.  The history is provided by the patient and medical records. No language interpreter was used.      Past Medical History:  Diagnosis Date   Anxiety    Chronic back pain    Chronic left hip pain    Dental caries    Hypertension    Insomnia 09/2019    Patient Active Problem List   Diagnosis Date Noted   Grieving 10/19/2019   Morbidly obese (HCC) 10/19/2019   Insomnia 10/19/2019   Neuropathy 05/02/2019   Hot flashes 05/02/2019   Anxiety 03/27/2019   Shortness of breath 03/27/2019   Hypertension 03/27/2019   Chronic back pain 03/27/2019   Dental caries 03/27/2019   Class 3 severe obesity due to excess calories with serious comorbidity and body mass index (BMI) of 45.0 to 49.9 in adult [facility]) 03/27/2019    Past Surgical History:  Procedure Laterality Date   CESAREAN SECTION     x 4   TUBAL LIGATION       OB History     Gravida  5   Para  4   Term  2    Preterm  2   AB  1   Living  4      SAB  1   IAB  0   Ectopic  0   Multiple  0   Live Births  4        Obstetric Comments  c-sect x 4         Family History  Problem Relation Age of Onset   Hypertension Mother    Heart disease Father     Social History   Tobacco Use   Smoking status: Every Day    Packs/day: 0.50    Years: 15.00    Pack years: 7.50    Types: Cigarettes   Smokeless tobacco: Never   Tobacco comments:    started age 62  Vaping Use   Vaping Use: Never used  Substance Use Topics   Alcohol use: No   Drug use: No    Home Medications Prior to Admission medications   Medication Sig Start Date End Date Taking? Authorizing Provider  acetaminophen (TYLENOL) 500 MG tablet Take 2 tablets (1,000 mg total) by mouth every 8 (eight) hours as needed. 04/23/21   Rhys Martini, PA-C  tiZANidine (ZANAFLEX) 2 MG tablet Take 1  tablet (2 mg total) by mouth every 8 (eight) hours as needed for muscle spasms. 04/23/21   Rhys Martini, PA-C  gabapentin (NEURONTIN) 100 MG capsule Take 1 capsule (100 mg total) by mouth 2 (two) times daily. Patient not taking: Reported on 10/26/2020 10/16/19 04/23/21  Kallie Locks, FNP  hydrochlorothiazide (HYDRODIURIL) 25 MG tablet Take 1 tablet (25 mg total) by mouth daily. Patient not taking: Reported on 10/26/2020 10/16/19 04/23/21  Kallie Locks, FNP  metFORMIN (GLUCOPHAGE) 500 MG tablet Take 1 tablet (500 mg total) by mouth 2 (two) times daily with a meal. Patient not taking: Reported on 10/26/2020 10/16/19 04/23/21  Kallie Locks, FNP  traZODone (DESYREL) 100 MG tablet Take 1 tablet (100 mg total) by mouth at bedtime. Patient not taking: Reported on 10/26/2020 10/16/19 04/23/21  Kallie Locks, FNP    Allergies    Penicillins  Review of Systems   Review of Systems  Constitutional:  Negative for activity change, chills and fever.  Respiratory:  Negative for shortness of breath.   Cardiovascular:  Negative for chest  pain.  Gastrointestinal:  Negative for abdominal pain, constipation, diarrhea, nausea and vomiting.  Musculoskeletal:  Positive for arthralgias. Negative for back pain, joint swelling, myalgias, neck pain and neck stiffness.  Skin:  Negative for rash and wound.  Neurological:  Negative for syncope, weakness, light-headedness and numbness.   Physical Exam Updated Vital Signs BP 115/73 (BP Location: Left Arm)   Pulse 90   Temp 98.4 F (36.9 C)   Resp 14   Ht 5\' 3"  (1.6 m)   Wt 99.8 kg   SpO2 100%   BMI 38.97 kg/m   Physical Exam Vitals and nursing note reviewed.  Constitutional:      General: She is not in acute distress. HENT:     Head: Normocephalic.  Eyes:     Conjunctiva/sclera: Conjunctivae normal.  Cardiovascular:     Rate and Rhythm: Normal rate and regular rhythm.     Heart sounds: No murmur heard.   No friction rub. No gallop.  Pulmonary:     Effort: Pulmonary effort is normal. No respiratory distress.  Abdominal:     General: There is no distension.     Palpations: Abdomen is soft.  Musculoskeletal:     Cervical back: Neck supple.     Comments: Spine is nontender.  No crepitus or step-offs.  Tender palpation to the left calcaneus pain.  No erythema, edema, warmth, red streaking.  No wounds.  No tenderness palpation in the forefoot or midfoot.  Full active and passive range of motion of the left ankle.  No tenderness palpation to the left ankle.  No calf swelling or tenderness.  Skin:    General: Skin is warm.     Findings: No rash.  Neurological:     Mental Status: She is alert.  Psychiatric:        Behavior: Behavior normal.    ED Results / Procedures / Treatments   Labs (all labs ordered are listed, but only abnormal results are displayed) Labs Reviewed - No data to display  EKG None  Radiology DG Foot Complete Left  Result Date: 08/17/2021 CLINICAL DATA:  Left heel pain for several weeks EXAM: LEFT FOOT - COMPLETE 3+ VIEW COMPARISON:  None.  FINDINGS: Frontal, oblique, lateral views of the left foot are obtained. No fracture, subluxation, or dislocation. Small superior and inferior calcaneal spurs. Joint spaces are well preserved. Soft tissues are unremarkable. IMPRESSION: 1. Small calcaneal  spurs.  No acute bony abnormality. Electronically Signed   By: Sharlet Salina M.D.   On: 08/17/2021 22:23    Procedures Procedures   Medications Ordered in ED Medications  ibuprofen (ADVIL) tablet 600 mg (600 mg Oral Given 08/17/21 2151)  ketorolac (TORADOL) 30 MG/ML injection 15 mg (15 mg Intramuscular Given 08/18/21 0151)    ED Course  I have reviewed the triage vital signs and the nursing notes.  Pertinent labs & imaging results that were available during my care of the patient were reviewed by me and considered in my medical decision making (see chart for details).    MDM Rules/Calculators/A&P                           35 year old female with history of hypertension, chronic low back pain who presents the emergency department with a 3-week history of left heel pain in her foot.  She did previously have radicular sounding pain in her low back that would shoot down into her leg, but states that this has not been present for the last few weeks since the heel pain began.  Vital signs are stable.  On exam, she is focally tender to palpation to the left heel.  Ankle is nontender.  She has neurovascular intact.  X-ray of the left foot has been reviewed and independently interpreted by me.  Calcaneal bone spurs are present on x-ray.  Discussed findings with patient and her husband.  She will be given a work note.  She had plan to follow-up with podiatry, but had to cancel the appointment due to her work schedule.  I have encouraged her to call and follow back up with podiatry.  She will be given crutches to help with resting and nonweightbearing at home.  She is instructed to stretch the muscles of her foot as allowed, take OTC analgesia, and  elevate her leg at home.  Also discussed that some foot inserts with arch supportive care may be helpful.  Doubt puncture wounds, gout, septic arthritis, occult fracture.  She is hemodynamically stable in no acute distress.  Safe for discharge home with outpatient follow-up to podiatry.  Final Clinical Impression(s) / ED Diagnoses Final diagnoses:  Calcaneal spur of left foot    Rx / DC Orders ED Discharge Orders     None        Barkley Boards, PA-C 08/18/21 0159    Zadie Rhine, MD 08/18/21 [phone]

## 2021-08-18 NOTE — ED Notes (Signed)
Discharge instructions discussed with pt. Pt verbalized understanding with no questions at this time. Pt demonstrated appropriate use of crutches. Pt going home with s/o at bedside

## 2021-08-26 ENCOUNTER — Encounter: Payer: Self-pay | Admitting: Podiatry

## 2021-08-26 ENCOUNTER — Other Ambulatory Visit: Payer: Self-pay

## 2021-08-26 ENCOUNTER — Ambulatory Visit: Payer: BC Managed Care – PPO

## 2021-08-26 ENCOUNTER — Ambulatory Visit (INDEPENDENT_AMBULATORY_CARE_PROVIDER_SITE_OTHER): Payer: BC Managed Care – PPO | Admitting: Podiatry

## 2021-08-26 ENCOUNTER — Telehealth: Payer: Self-pay | Admitting: Podiatry

## 2021-08-26 DIAGNOSIS — M722 Plantar fascial fibromatosis: Secondary | ICD-10-CM

## 2021-08-26 DIAGNOSIS — M7662 Achilles tendinitis, left leg: Secondary | ICD-10-CM | POA: Diagnosis not present

## 2021-08-26 DIAGNOSIS — M5432 Sciatica, left side: Secondary | ICD-10-CM | POA: Diagnosis not present

## 2021-08-26 MED ORDER — MELOXICAM 15 MG PO TABS: 15.0000 mg | ORAL_TABLET | Freq: Every day | ORAL | 3 refills | Status: DC

## 2021-08-26 MED ORDER — PREDNISONE 10 MG PO TABS: ORAL_TABLET | ORAL | 0 refills | Status: AC

## 2021-08-26 NOTE — Telephone Encounter (Signed)
Patient called to request that prescription discussed at today's appt be sent to pharmacy on file.  Thanks.

## 2021-08-26 NOTE — Patient Instructions (Signed)
Look for an "EvenUp" shoe attachment on [employer] or at [employer]. This will level out your hips while you are walking in the CAM boot. Wear this on the other foot around a supportive sneaker: ° ° ° ° ° °Achilles Tendinitis  °with Rehab °Achilles tendinitis is a disorder of the Achilles tendon. The Achilles tendon connects the large calf muscles (Gastrocnemius and Soleus) to the heel bone (calcaneus). This tendon is sometimes called the heel cord. It is important for pushing-off and standing on your toes and is important for walking, running, or jumping. Tendinitis is often caused by overuse and repetitive microtrauma. °SYMPTOMS °Pain, tenderness, swelling, warmth, and redness may occur over the Achilles tendon even at rest. °Pain with pushing off, or flexing or extending the ankle. °Pain that is worsened after or during activity. °CAUSES  °Overuse sometimes seen with rapid increase in exercise programs or in sports requiring running and jumping. °Poor physical conditioning (strength and flexibility or endurance). °Running sports, especially training running down hills. °Inadequate warm-up before practice or play or failure to stretch before participation. °Injury to the tendon. °PREVENTION  °Warm up and stretch before practice or competition. °Allow time for adequate rest and recovery between practices and competition. °Keep up conditioning. °Keep up ankle and leg flexibility. °Improve or keep muscle strength and endurance. °Improve cardiovascular fitness. °Use proper technique. °Use proper equipment (shoes, skates). °To help prevent recurrence, taping, protective strapping, or an adhesive bandage may be recommended for several weeks after healing is complete. °PROGNOSIS  °Recovery may take weeks to several months to heal. °Longer recovery is expected if symptoms have been prolonged. °Recovery is usually quicker if the inflammation is due to a direct blow as compared with overuse or sudden strain. °RELATED COMPLICATIONS   °Healing time will be prolonged if the condition is not correctly treated. The injury must be given plenty of time to heal. °Symptoms can reoccur if activity is resumed too soon. °Untreated, tendinitis may increase the risk of tendon rupture requiring additional time for recovery and possibly surgery. °TREATMENT  °The first treatment consists of rest anti-inflammatory medication, and ice to relieve the pain. °Stretching and strengthening exercises after resolution of pain will likely help reduce the risk of recurrence. Referral to a physical therapist or athletic trainer for further evaluation and treatment may be helpful. °A walking boot or cast may be recommended to rest the Achilles tendon. This can help break the cycle of inflammation and microtrauma. °Arch supports (orthotics) may be prescribed or recommended by your caregiver as an adjunct to therapy and rest. °Surgery to remove the inflamed tendon lining or degenerated tendon tissue is rarely necessary and has shown less than predictable results. °MEDICATION  °Nonsteroidal anti-inflammatory medications, such as aspirin and ibuprofen, may be used for pain and inflammation relief. Do not take within 7 days before surgery. Take these as directed by your caregiver. Contact your caregiver immediately if any bleeding, stomach upset, or signs of allergic reaction occur. Other minor pain relievers, such as acetaminophen, may also be used. °Pain relievers may be prescribed as necessary by your caregiver. Do not take prescription pain medication for longer than 4 to 7 days. Use only as directed and only as much as you need. °Cortisone injections are rarely indicated. Cortisone injections may weaken tendons and predispose to rupture. It is better to give the condition more time to heal than to use them. °HEAT AND COLD °Cold is used to relieve pain and reduce inflammation for acute and chronic Achilles tendinitis. Cold   should be applied for 10 to 15 minutes every 2 to 3  hours for inflammation and pain and immediately after any activity that aggravates your symptoms. Use ice packs or an ice massage. °Heat may be used before performing stretching and strengthening activities prescribed by your caregiver. Use a heat pack or a warm soak. °SEEK MEDICAL CARE IF: °Symptoms get worse or do not improve in 2 weeks despite treatment. °New, unexplained symptoms develop. Drugs used in treatment may produce side effects. ° °EXERCISES: ° °RANGE OF MOTION (ROM) AND STRETCHING EXERCISES - Achilles Tendinitis  °These exercises may help you when beginning to rehabilitate your injury. Your symptoms may resolve with or without further involvement from your physician, physical therapist or athletic trainer. While completing these exercises, remember:  °Restoring tissue flexibility helps normal motion to return to the joints. This allows healthier, less painful movement and activity. °An effective stretch should be held for at least 30 seconds. °A stretch should never be painful. You should only feel a gentle lengthening or release in the stretched tissue. ° °STRETCH  Gastroc, Standing  °Place hands on wall. °Extend right / left leg, keeping the front knee somewhat bent. °Slightly point your toes inward on your back foot. °Keeping your right / left heel on the floor and your knee straight, shift your weight toward the wall, not allowing your back to arch. °You should feel a gentle stretch in the right / left calf. Hold this position for 10 seconds. °Repeat 3 times. Complete this stretch 2 times per day. ° °STRETCH  Soleus, Standing  °Place hands on wall. °Extend right / left leg, keeping the other knee somewhat bent. °Slightly point your toes inward on your back foot. °Keep your right / left heel on the floor, bend your back knee, and slightly shift your weight over the back leg so that you feel a gentle stretch deep in your back calf. °Hold this position for 10 seconds. °Repeat 3 times. Complete this  stretch 2 times per day. ° °STRETCH  Gastrocsoleus, Standing  °Note: This exercise can place a lot of stress on your foot and ankle. Please complete this exercise only if specifically instructed by your caregiver.  °Place the ball of your right / left foot on a step, keeping your other foot firmly on the same step. °Hold on to the wall or a rail for balance. °Slowly lift your other foot, allowing your body weight to press your heel down over the edge of the step. °You should feel a stretch in your right / left calf. °Hold this position for 10 seconds. °Repeat this exercise with a slight bend in your knee. °Repeat 3 times. Complete this stretch 2 times per day.  ° °STRENGTHENING EXERCISES - Achilles Tendinitis °These exercises may help you when beginning to rehabilitate your injury. They may resolve your symptoms with or without further involvement from your physician, physical therapist or athletic trainer. While completing these exercises, remember:  °Muscles can gain both the endurance and the strength needed for everyday activities through controlled exercises. °Complete these exercises as instructed by your physician, physical therapist or athletic trainer. Progress the resistance and repetitions only as guided. °You may experience muscle soreness or fatigue, but the pain or discomfort you are trying to eliminate should never worsen during these exercises. If this pain does worsen, stop and make certain you are following the directions exactly. If the pain is still present after adjustments, discontinue the exercise until you can discuss the trouble with   your clinician. ° °STRENGTH - Plantar-flexors  °Sit with your right / left leg extended. Holding onto both ends of a rubber exercise band/tubing, loop it around the ball of your foot. Keep a slight tension in the band. °Slowly push your toes away from you, pointing them downward. °Hold this position for 10 seconds. Return slowly, controlling the tension in the  band/tubing. °Repeat 3 times. Complete this exercise 2 times per day.  ° °STRENGTH - Plantar-flexors  °Stand with your feet shoulder width apart. Steady yourself with a wall or table using as little support as needed. °Keeping your weight evenly spread over the width of your feet, rise up on your toes.* °Hold this position for 10 seconds. °Repeat 3 times. Complete this exercise 2 times per day.  °*If this is too easy, shift your weight toward your right / left leg until you feel challenged. Ultimately, you may be asked to do this exercise with your right / left foot only. ° °STRENGTH  Plantar-flexors, Eccentric  °Note: This exercise can place a lot of stress on your foot and ankle. Please complete this exercise only if specifically instructed by your caregiver.  °Place the balls of your feet on a step. With your hands, use only enough support from a wall or rail to keep your balance. °Keep your knees straight and rise up on your toes. °Slowly shift your weight entirely to your right / left toes and pick up your opposite foot. Gently and with controlled movement, lower your weight through your right / left foot so that your heel drops below the level of the step. You will feel a slight stretch in the back of your calf at the end position. °Use the healthy leg to help rise up onto the balls of both feet, then lower weight only on the right / left leg again. Build up to 15 repetitions. Then progress to 3 consecutive sets of 15 repetitions.* °After completing the above exercise, complete the same exercise with a slight knee bend (about 30 degrees). Again, build up to 15 repetitions. Then progress to 3 consecutive sets of 15 repetitions.* °Perform this exercise 2 times per day.  °*When you easily complete 3 sets of 15, your physician, physical therapist or athletic trainer may advise you to add resistance by wearing a backpack filled with additional weight. ° °STRENGTH - Plantar Flexors, Seated  °Sit on a chair that  allows your feet to rest flat on the ground. If necessary, sit at the edge of the chair. °Keeping your toes firmly on the ground, lift your right / left heel as far as you can without increasing any discomfort in your ankle. °Repeat 3 times. Complete this exercise 2 times a day. ° °

## 2021-08-29 NOTE — Progress Notes (Signed)
  Subjective:  Patient ID: Destiny Cook, female    DOB: May 17, 1986,  MRN: [id number]  Chief Complaint  Patient presents with   Foot Pain    )-(np) left heel severe pain    35 y.o. female presents with the above complaint. History confirmed with patient.  Pain is quite severe its been going on for over a year.  Describes it as pulling and burning shooting pain of the back of her calf.  Also has a history of low back pain.  She has numbness and tingling in the foot sometimes as well.  Objective:  Physical Exam: warm, good capillary refill, no trophic changes or ulcerative lesions, normal DP and PT pulses, and normal sensory exam. Left Foot: Sharp pain on the inferior and posterior heel is worse with resisted dorsiflexion there is pain below the Achilles tendon she has pain in the tarsal tunnel with palpation and percussion  Radiographs: Multiple views x-ray of the left foot: no fracture, dislocation, swelling or degenerative changes noted, plantar calcaneal spur, and posterior calcaneal spur Assessment:   1. Achilles tendinitis, left leg   2. Plantar fasciitis of left foot   3. Sciatica of left side      Plan:  Patient was evaluated and treated and all questions answered.  Discussed the etiology and treatment options for Achilles tendinitis including stretching, formal physical therapy with an eccentric exercises therapy plan, supportive shoegears such as a running shoe or sneaker, heel lifts, topical and oral medications.  We also discussed that I do not routinely perform injections in this area because of the risk of an increased damage or rupture of the tendon.  We also discussed the role of surgical treatment of this for patients who do not improve after exhausting non-surgical treatment options.  -XR reviewed with patient -Educated on stretching and icing of the affected limb. -I recommended she go into a cam boot and this was dispensed today -Rx for Medrol and Meloxicam.  Advised to start Meloxicam the day after completion of oral steroid taper.  -I think a large component of this may be neuritic nerve pain as well.  I am placing a referral for her to [facility] for evaluation.   Return in about 4 weeks (around 09/23/2021) for re-check Achilles tendon.

## 2021-09-23 ENCOUNTER — Other Ambulatory Visit: Payer: Self-pay

## 2021-09-23 ENCOUNTER — Ambulatory Visit (INDEPENDENT_AMBULATORY_CARE_PROVIDER_SITE_OTHER): Payer: BC Managed Care – PPO | Admitting: Podiatry

## 2021-09-23 DIAGNOSIS — M7662 Achilles tendinitis, left leg: Secondary | ICD-10-CM

## 2021-09-23 DIAGNOSIS — M5432 Sciatica, left side: Secondary | ICD-10-CM

## 2021-09-23 MED ORDER — NABUMETONE 500 MG PO TABS: 500.0000 mg | ORAL_TABLET | Freq: Two times a day (BID) | ORAL | 0 refills | Status: AC | PRN

## 2021-09-23 MED ORDER — TRAMADOL HCL 50 MG PO TABS: 50.0000 mg | ORAL_TABLET | Freq: Four times a day (QID) | ORAL | 0 refills | Status: AC | PRN

## 2021-09-27 NOTE — Progress Notes (Signed)
  Subjective:  Patient ID: Destiny Cook, female    DOB: 1986-04-20,  MRN: [id number]  Chief Complaint  Patient presents with   Tendonitis    re-check Achilles tendon    35 y.o. female presents with the above complaint. History confirmed with patient.  Pain is quite severe and is no better.  She did not see the neurosurgery or spine doctor as yet  Objective:  Physical Exam: warm, good capillary refill, no trophic changes or ulcerative lesions, normal DP and PT pulses, and normal sensory exam. Left Foot: Sharp pain on the inferior and posterior heel is worse with resisted dorsiflexion there is pain below the Achilles tendon she has pain in the tarsal tunnel with palpation and percussion  Radiographs: Multiple views x-ray of the left foot: no fracture, dislocation, swelling or degenerative changes noted, plantar calcaneal spur, and posterior calcaneal spur Assessment:   1. Achilles tendinitis, left leg   2. Sciatica of left side      Plan:  Patient was evaluated and treated and all questions answered.  Discussed the etiology and treatment options for Achilles tendinitis including stretching, formal physical therapy with an eccentric exercises therapy plan, supportive shoegears such as a running shoe or sneaker, heel lifts, topical and oral medications.  We also discussed that I do not routinely perform injections in this area because of the risk of an increased damage or rupture of the tendon.  We also discussed the role of surgical treatment of this for patients who do not improve after exhausting non-surgical treatment options.  -XR reviewed with patient -Educated on stretching and icing of the affected limb. -Continue CAM boot. -Medrol meloxicam did not help.  I recommended we try nabumetone.  She also requested something stronger than this.  I did refill her a prescription of tramadol for short period.  Cautioned her on use and possible side effects. -At this point she has not  improved either and I suspect we should obtain an MRI to evaluate this.  -I think a large component of this may be neuritic nerve pain as well.  I am placing a referral for her to [facility] for evaluation.  She will follow-up me after the MRI   No follow-ups on file.

## 2021-10-13 ENCOUNTER — Telehealth: Payer: Self-pay | Admitting: Podiatry

## 2021-10-13 NOTE — Telephone Encounter (Signed)
Pt called stating she went to see the back and spine specialist. She would like for you to give her a call at your earliest convenience. Please Advise.

## 2021-10-21 ENCOUNTER — Other Ambulatory Visit: Payer: Self-pay

## 2021-10-21 ENCOUNTER — Ambulatory Visit (INDEPENDENT_AMBULATORY_CARE_PROVIDER_SITE_OTHER): Payer: BC Managed Care – PPO | Admitting: Podiatry

## 2021-10-21 DIAGNOSIS — M7662 Achilles tendinitis, left leg: Secondary | ICD-10-CM | POA: Diagnosis not present

## 2021-10-21 DIAGNOSIS — M5432 Sciatica, left side: Secondary | ICD-10-CM | POA: Diagnosis not present

## 2021-10-21 NOTE — Progress Notes (Signed)
  Subjective:  Patient ID: Destiny Cook, female    DOB: [birth year],  MRN: [id number]  Chief Complaint  Patient presents with   Tendonitis       achilles tendon pain, L foot follow up    35 y.o. female presents with the above complaint. History confirmed with patient.  She saw the spine specialist they took x-rays or MRI and begin physical therapy with her  Objective:  Physical Exam: warm, good capillary refill, no trophic changes or ulcerative lesions, normal DP and PT pulses, and normal sensory exam. Left Foot: Sharp pain on the inferior and posterior heel is worse with resisted dorsiflexion there is pain below the Achilles tendon she has pain in the tarsal tunnel with palpation and percussion  Radiographs: Multiple views x-ray of the left foot: no fracture, dislocation, swelling or degenerative changes noted, plantar calcaneal spur, and posterior calcaneal spur Assessment:   1. Achilles tendinitis, left leg   2. Sciatica of left side      Plan:  Patient was evaluated and treated and all questions answered.  Discussed the etiology and treatment options for Achilles tendinitis including stretching, formal physical therapy with an eccentric exercises therapy plan, supportive shoegears such as a running shoe or sneaker, heel lifts, topical and oral medications.  We also discussed that I do not routinely perform injections in this area because of the risk of an increased damage or rupture of the tendon.  We also discussed the role of surgical treatment of this for patients who do not improve after exhausting non-surgical treatment options.  MRI for back a scheduled for 12/7, I advised her to call to schedule her left lower extremity MRI for the same day as well.  We gave her the number for this.  I will see her back after this.  I still think that likely this is mostly spinal pathology   Return for call to schedule appointment 1-2 weeks after MRI to review.

## 2021-10-21 NOTE — Patient Instructions (Signed)
Please call [facility] to schedule the MRI of left heel [phone]

## 2022-02-28 ENCOUNTER — Encounter (HOSPITAL_BASED_OUTPATIENT_CLINIC_OR_DEPARTMENT_OTHER): Payer: Self-pay | Admitting: Emergency Medicine

## 2022-02-28 ENCOUNTER — Other Ambulatory Visit: Payer: Self-pay

## 2022-02-28 ENCOUNTER — Emergency Department (HOSPITAL_BASED_OUTPATIENT_CLINIC_OR_DEPARTMENT_OTHER)
Admission: EM | Admit: 2022-02-28 | Discharge: 2022-02-28 | Discharge status: Against Medical Advice | Payer: BC Managed Care – PPO | Attending: Emergency Medicine | Admitting: Emergency Medicine

## 2022-02-28 DIAGNOSIS — H5711 Ocular pain, right eye: Secondary | ICD-10-CM | POA: Insufficient documentation

## 2022-02-28 DIAGNOSIS — Z5321 Procedure and treatment not carried out due to patient leaving prior to being seen by health care provider: Secondary | ICD-10-CM | POA: Insufficient documentation

## 2022-02-28 NOTE — ED Notes (Signed)
Pt left post triage pre provider evaluation  

## 2022-02-28 NOTE — ED Triage Notes (Signed)
Right eye stye, drainage and pain x 2 days  ?

## 2022-03-01 ENCOUNTER — Emergency Department (HOSPITAL_COMMUNITY)
Admission: EM | Admit: 2022-03-01 | Discharge: 2022-03-01 | Disposition: A | Discharge status: Home / Self Care | Payer: BC Managed Care – PPO | Attending: Emergency Medicine | Admitting: Emergency Medicine

## 2022-03-01 ENCOUNTER — Encounter (HOSPITAL_COMMUNITY): Payer: Self-pay | Admitting: Emergency Medicine

## 2022-03-01 ENCOUNTER — Emergency Department (HOSPITAL_COMMUNITY): Payer: BC Managed Care – PPO

## 2022-03-01 DIAGNOSIS — H5789 Other specified disorders of eye and adnexa: Secondary | ICD-10-CM | POA: Diagnosis present

## 2022-03-01 DIAGNOSIS — L03213 Periorbital cellulitis: Secondary | ICD-10-CM | POA: Insufficient documentation

## 2022-03-01 DIAGNOSIS — I10 Essential (primary) hypertension: Secondary | ICD-10-CM | POA: Diagnosis not present

## 2022-03-01 MED ORDER — AMOXICILLIN-POT CLAVULANATE 875-125 MG PO TABS: 1.0000 | ORAL_TABLET | Freq: Two times a day (BID) | ORAL | 0 refills | Status: AC

## 2022-03-01 MED ORDER — ACETAMINOPHEN 500 MG PO TABS
1000.0000 mg | ORAL_TABLET | Freq: Once | ORAL | Status: AC
  Administered 2022-03-01: 1000 mg via ORAL
  Filled 2022-03-01: qty 2

## 2022-03-01 NOTE — ED Provider Notes (Signed)
?[facility]-EMERGENCY DEPT ?Provider Note ? ? ?CSN: [id number] ?Arrival date & time: 03/01/22  1551 ? ?  ? ?History ? ?Chief Complaint  ?Patient presents with  ? Eye Problem  ? ? ?Destiny Cook is a 36 y.o. female.  Patient complains of right eye swelling and pain for the past 3 days.  Patient denies any systemic symptoms including fever.  Denies vision change.  Denies discharge from the actual eye.  Patient does not wear corrective lenses.  Past medical history significant for high blood pressure, anxiety, insomnia, dental caries ? ?HPI ? ?  ? ?Home Medications ?Prior to Admission medications   ?Medication Sig Start Date End Date Taking? Authorizing Provider  ?amoxicillin-clavulanate (AUGMENTIN) 875-125 MG tablet Take 1 tablet by mouth every 12 (twelve) hours. 03/01/22  Yes Darrick Grinder, PA-C  ?acetaminophen (TYLENOL) 500 MG tablet Take 2 tablets (1,000 mg total) by mouth every 8 (eight) hours as needed. 04/23/21   Rhys Martini, PA-C  ?cyclobenzaprine (FLEXERIL) 10 MG tablet Take 10 mg by mouth 3 (three) times daily. 07/16/21   [provider]  ?tiZANidine (ZANAFLEX) 2 MG tablet Take 1 tablet (2 mg total) by mouth every 8 (eight) hours as needed for muscle spasms. 04/23/21   Rhys Martini, PA-C  ?gabapentin (NEURONTIN) 100 MG capsule Take 1 capsule (100 mg total) by mouth 2 (two) times daily. ?Patient not taking: Reported on 10/26/2020 10/16/19 04/23/21  Kallie Locks, FNP  ?hydrochlorothiazide (HYDRODIURIL) 25 MG tablet Take 1 tablet (25 mg total) by mouth daily. ?Patient not taking: Reported on 10/26/2020 10/16/19 04/23/21  Kallie Locks, FNP  ?metFORMIN (GLUCOPHAGE) 500 MG tablet Take 1 tablet (500 mg total) by mouth 2 (two) times daily with a meal. ?Patient not taking: Reported on 10/26/2020 10/16/19 04/23/21  Kallie Locks, FNP  ?traZODone (DESYREL) 100 MG tablet Take 1 tablet (100 mg total) by mouth at bedtime. ?Patient not taking: Reported on 10/26/2020 10/16/19 04/23/21   Kallie Locks, FNP  ?   ? ?Allergies    ?Penicillins   ? ?Review of Systems   ?Review of Systems  ?HENT:    ?     Swollen right eyelid  ?Eyes:  Negative for photophobia, redness and visual disturbance.  ? ?Physical Exam ?Updated Vital Signs ?BP 130/90   Pulse 97   Temp 97.9 ?F (36.6 ?C) (Oral)   Resp 16   Ht 5\' 5"  (1.651 m)   Wt 99.7 kg   LMP 02/21/2022 (Approximate)   SpO2 100%   BMI 36.58 kg/m?  ?Physical Exam ?Vitals and nursing note reviewed.  ?Constitutional:   ?   General: She is not in acute distress. ?HENT:  ?   Head: Normocephalic.  ?Eyes:  ?   Extraocular Movements: Extraocular movements intact.  ?   Conjunctiva/sclera:  ?   Right eye: Right conjunctiva is not injected.  ?   Left eye: Left conjunctiva is not injected.  ?   Pupils: Pupils are equal, round, and reactive to light.  ? ?Musculoskeletal:  ?   Cervical back: Normal range of motion.  ?Neurological:  ?   Mental Status: She is alert.  ? ? ?ED Results / Procedures / Treatments   ?Labs ?(all labs ordered are listed, but only abnormal results are displayed) ?Labs Reviewed - No data to display ? ?EKG ?None ? ?Radiology ?CT OrbitsS W/O CM ? ?Result Date: 03/01/2022 ?CLINICAL DATA:  Pain, swelling and drainage from the right eye for 2 days. EXAM:  CT ORBITS WITHOUT CONTRAST TECHNIQUE: Multidetector CT imaging of the orbits was performed using the standard protocol without intravenous contrast. Multiplanar CT image reconstructions were also generated. RADIATION DOSE REDUCTION: This exam was performed according to the departmental dose-optimization program which includes automated exposure control, adjustment of the mA and/or kV according to patient size and/or use of iterative reconstruction technique. COMPARISON:  No pertinent prior exam. FINDINGS: Orbits: The globes are intact. There is moderate periorbital (preseptal) cellulitis on the right side but no intraorbital (postseptal) involvement to suggest orbital cellulitis. The bony orbits are  intact. Visible paranasal sinuses: Moderate diffuse mucoperiosteal thickening involving the right maxillary sinus. Minimal mucoperiosteal thickening involving the left maxillary sinus. The ethmoid and sphenoid sinuses are clear. The patient does not have formed frontal sinuses. The mastoid air cells and middle ear cavities are clear. Soft tissues: No soft tissue mass or abscess. Osseous: Intact bony structures. Limited intracranial: No significant intracranial findings. IMPRESSION: 1. Moderate periorbital (preseptal) cellulitis on the right side but no intraorbital (postseptal) involvement to suggest orbital cellulitis. 2. Bilateral maxillary sinus disease, right greater than left. Electronically Signed   By: Rudie Meyer M.D.   On: 03/01/2022 18:03   ? ?Procedures ?Procedures  ? ?Medications Ordered in ED ?Medications  ?acetaminophen (TYLENOL) tablet 1,000 mg (1,000 mg Oral Given 03/01/22 1721)  ? ? ?ED Course/ Medical Decision Making/ A&P ?  ?                        ?Medical Decision Making ?Amount and/or Complexity of Data Reviewed ?Radiology: ordered. ? ?Risk ?OTC drugs. ? ? ?This patient presents to the ED for concern of eyelid pain, this involves an extensive number of treatment options, and is a complaint that carries with it a high risk of complications and morbidity.  The differential diagnosis includes preseptal cellulitis, orbital cellulitis, conjunctivitis, others ? ? ?Co morbidities that complicate the patient evaluation ? ?None ? ? ?Imaging Studies ordered: ? ?I ordered imaging studies including CT orbits w/o contrast  ?I independently visualized and interpreted imaging which showed findings consistent with preseptal cellulitis, no evidence of orbital cellulitis ?I agree with the radiologist interpretation ? ? ? ?Problem List / ED Course / Critical interventions / Medication management ? ? ?I ordered medication including tylenol  for pain  ?Reevaluation of the patient after these medicines showed that  the patient improved ?I have reviewed the patients home medicines and have made adjustments as needed ? ? ?Test / Admission - Considered: ? ?Visual acuity normal, no pain with eye movements, mild proptosis of right eye. Based on physical exam, Unable to rule out orbital cellulitis without imaging.   ?CT shows evidence of preseptal cellulitis. Patient can best be treated outpatient with antibiotic therapy. Discharge home with return precautions ? ?Final Clinical Impression(s) / ED Diagnoses ?Final diagnoses:  ?Preseptal cellulitis of right eye  ? ? ?Rx / DC Orders ?ED Discharge Orders   ? ?      Ordered  ?  amoxicillin-clavulanate (AUGMENTIN) 875-125 MG tablet  Every 12 hours       ? 03/01/22 1814  ? ?  ?  ? ?  ? ? ?  ?Darrick Grinder, PA-C ?03/01/22 1814 ? ?  ?Ernie Avena, MD ?03/01/22 2107 ? ?

## 2022-03-01 NOTE — ED Triage Notes (Signed)
Pt reports pain, itchiness, and drainage in her right eye beginning two days ago. Pt denies injury to her eye. Pt's right eye is swollen.  ?

## 2022-03-01 NOTE — Discharge Instructions (Signed)
You were seen today for eye pain. You have been diagnosed with preseptal cellulitis. I have prescribed an antibiotic. You should start to see an improvement in the next few days. If no improvement after 48 hours, you should be re-evaluated ?

## 2022-03-03 ENCOUNTER — Other Ambulatory Visit: Payer: Self-pay

## 2022-03-03 ENCOUNTER — Encounter (HOSPITAL_COMMUNITY): Payer: Self-pay

## 2022-03-03 ENCOUNTER — Emergency Department (HOSPITAL_COMMUNITY)
Admission: EM | Admit: 2022-03-03 | Discharge: 2022-03-03 | Disposition: A | Discharge status: Home / Self Care | Payer: BC Managed Care – PPO | Attending: Emergency Medicine | Admitting: Emergency Medicine

## 2022-03-03 DIAGNOSIS — R2232 Localized swelling, mass and lump, left upper limb: Secondary | ICD-10-CM | POA: Insufficient documentation

## 2022-03-03 DIAGNOSIS — Z5321 Procedure and treatment not carried out due to patient leaving prior to being seen by health care provider: Secondary | ICD-10-CM | POA: Insufficient documentation

## 2022-03-03 NOTE — ED Notes (Signed)
Patient seen walking out of department and told staff she wants to go to urgent care in the morning.  ?

## 2022-03-03 NOTE — ED Triage Notes (Signed)
Pt reports left shoulder swelling since last night. Pt states that she was bit by something.  ?

## 2022-05-16 ENCOUNTER — Encounter: Payer: Self-pay | Admitting: Nurse Practitioner

## 2022-05-16 ENCOUNTER — Ambulatory Visit (INDEPENDENT_AMBULATORY_CARE_PROVIDER_SITE_OTHER): Payer: BC Managed Care – PPO | Admitting: Nurse Practitioner

## 2022-05-16 VITALS — BP 125/87 | HR 95 | Temp 97.9°F | Ht 63.0 in | Wt 247.8 lb

## 2022-05-16 DIAGNOSIS — M542 Cervicalgia: Secondary | ICD-10-CM | POA: Diagnosis not present

## 2022-05-16 DIAGNOSIS — M25512 Pain in left shoulder: Secondary | ICD-10-CM | POA: Diagnosis not present

## 2022-05-16 DIAGNOSIS — I10 Essential (primary) hypertension: Secondary | ICD-10-CM

## 2022-05-16 MED ORDER — TIZANIDINE HCL 4 MG PO TABS: 4.0000 mg | ORAL_TABLET | Freq: Four times a day (QID) | ORAL | 0 refills | Status: AC | PRN

## 2022-05-16 MED ORDER — PREDNISONE 20 MG PO TABS: 20.0000 mg | ORAL_TABLET | Freq: Every day | ORAL | 0 refills | Status: AC

## 2022-05-16 NOTE — Progress Notes (Signed)
@Patient  ID: , female    DOB: [birth year], 36 y.o.   MRN: [id number]  Chief Complaint  Patient presents with   Follow-up    Patient is here today to discuss hypertension and right leg and arm pain. Patient states that she has been having tingling, pinching, numbness in her right leg, hip and shoulder x 2 month due to a previous fall she had.  Patient is also needing refills on all medications and will need and x-ray or MRI done for right her hip, leg and arm and shoulder.    Referring provider: [id number], FNP    HPI  Patient presents today for follow-up on hypertension.  She states that she has been out of her medications for quite some time.  Her blood pressure is within normal range.  We discussed that we can trial going off of her blood pressure medicine at this point.  Patient is also concerned today about ongoing left shoulder pain and elbow pain.  She states that she feels a pinching sensation to her left shoulder.  She does have decreased range of motion.  States that the pain radiates down her arm and into her elbow.  Denies f/c/s, n/v/d, hemoptysis, PND, leg swelling Denies chest pain or edema        Allergies  Allergen Reactions   Penicillins Itching and Rash    Immunization History  Administered Date(s) Administered   Influenza,inj,Quad PF,6+ Mos 10/16/2019, 11/19/2019   Influenza-Unspecified 08/21/2020   Tdap 03/26/2019    Past Medical History:  Diagnosis Date   Anxiety    Chronic back pain    Chronic left hip pain    Dental caries    Hypertension    Insomnia 09/2019    Tobacco History: Social History   Tobacco Use  Smoking Status Every Day   Packs/day: 1.00   Years: 15.00   Total pack years: 15.00   Types: Cigarettes  Smokeless Tobacco Never  Tobacco Comments   started age 37; 1 pack per week   Ready to quit: Not Answered Counseling given: Not Answered Tobacco comments: started age 41; 1 pack per week   Outpatient  Encounter Medications as of 05/16/2022  Medication Sig   predniSONE (DELTASONE) 20 MG tablet Take 1 tablet (20 mg total) by mouth daily with breakfast for 5 days.   tiZANidine (ZANAFLEX) 2 MG tablet Take 1 tablet (2 mg total) by mouth every 8 (eight) hours as needed for muscle spasms.   tiZANidine (ZANAFLEX) 4 MG tablet Take 1 tablet (4 mg total) by mouth every 6 (six) hours as needed for muscle spasms.   [DISCONTINUED] gabapentin (NEURONTIN) 100 MG capsule Take 1 capsule (100 mg total) by mouth 2 (two) times daily.   [DISCONTINUED] hydrochlorothiazide (HYDRODIURIL) 25 MG tablet Take 1 tablet (25 mg total) by mouth daily.   [DISCONTINUED] metFORMIN (GLUCOPHAGE) 500 MG tablet Take 1 tablet (500 mg total) by mouth 2 (two) times daily with a meal.   [DISCONTINUED] traZODone (DESYREL) 100 MG tablet Take 1 tablet (100 mg total) by mouth at bedtime.   acetaminophen (TYLENOL) 500 MG tablet Take 2 tablets (1,000 mg total) by mouth every 8 (eight) hours as needed. (Patient not taking: Reported on 05/16/2022)   amoxicillin-clavulanate (AUGMENTIN) 875-125 MG tablet Take 1 tablet by mouth every 12 (twelve) hours. (Patient not taking: Reported on 05/16/2022)   cyclobenzaprine (FLEXERIL) 10 MG tablet Take 10 mg by mouth 3 (three) times daily. (Patient not taking: Reported on 05/16/2022)   No  facility-administered encounter medications on file as of 05/16/2022.     Review of Systems  Review of Systems  Constitutional: Negative.   HENT: Negative.    Cardiovascular: Negative.   Gastrointestinal: Negative.   Musculoskeletal:  Positive for arthralgias, back pain and myalgias.  Allergic/Immunologic: Negative.   Neurological: Negative.   Psychiatric/Behavioral: Negative.         Physical Exam  BP 125/87   Pulse 95   Temp 97.9 F (36.6 C)   Ht 5\' 3"  (1.6 m)   Wt 247 lb 12.8 oz (112.4 kg)   SpO2 98%   BMI 43.90 kg/m   Wt Readings from Last 5 Encounters:  05/16/22 247 lb 12.8 oz (112.4 kg)  03/03/22  219 lb (99.3 kg)  03/01/22 219 lb 12.8 oz (99.7 kg)  02/28/22 220 lb (99.8 kg)  08/17/21 220 lb (99.8 kg)     Physical Exam Vitals and nursing note reviewed.  Constitutional:      General: She is not in acute distress.    Appearance: She is well-developed.  Cardiovascular:     Rate and Rhythm: Normal rate and regular rhythm.  Pulmonary:     Effort: Pulmonary effort is normal.     Breath sounds: Normal breath sounds.  Musculoskeletal:     Left shoulder: Tenderness present. Decreased range of motion.  Neurological:     Mental Status: She is alert and oriented to person, place, and time.      Lab Results:  CBC    Component Value Date/Time   WBC 7.2 05/16/2022 1440   WBC 10.3 07/29/2020 2152   RBC 4.09 05/16/2022 1440   RBC 4.77 07/29/2020 2152   HGB 11.6 05/16/2022 1440   HCT 34.5 05/16/2022 1440   PLT 213 05/16/2022 1440   MCV 84 05/16/2022 1440   MCH 28.4 05/16/2022 1440   MCH 27.9 07/29/2020 2152   MCHC 33.6 05/16/2022 1440   MCHC 33.3 07/29/2020 2152   RDW 13.1 05/16/2022 1440   LYMPHSABS 2.1 07/29/2020 2152   MONOABS 0.6 07/29/2020 2152   EOSABS 0.3 07/29/2020 2152   BASOSABS 0.1 07/29/2020 2152    BMET    Component Value Date/Time   NA 144 05/16/2022 1440   K 3.8 05/16/2022 1440   CL 108 (H) 05/16/2022 1440   CO2 21 05/16/2022 1440   GLUCOSE 101 (H) 05/16/2022 1440   GLUCOSE 106 (H) 07/29/2020 2152   BUN 8 05/16/2022 1440   CREATININE 0.89 05/16/2022 1440   CALCIUM 9.1 05/16/2022 1440   GFRNONAA 59 (L) 07/29/2020 2152   GFRAA >60 07/29/2020 2152    BNP No results found for: "BNP"  ProBNP No results found for: "PROBNP"  Imaging: No results found.   Assessment & Plan:   Acute pain of left shoulder - DG Shoulder Left - DG Cervical Spine Complete  2. Neck pain  - DG Shoulder Left - DG Cervical Spine Complete  Follow up:  Follow up in  1 month or sooner if needed     2153, NP 05/18/2022

## 2022-05-17 LAB — COMPREHENSIVE METABOLIC PANEL
ALT: 14 IU/L (ref 0–32)
AST: 20 IU/L (ref 0–40)
Albumin/Globulin Ratio: 1.6 (ref 1.2–2.2)
Albumin: 4.2 g/dL (ref 3.8–4.8)
Alkaline Phosphatase: 79 IU/L (ref 44–121)
BUN/Creatinine Ratio: 9 (ref 9–23)
BUN: 8 mg/dL (ref 6–20)
Bilirubin Total: 0.2 mg/dL (ref 0.0–1.2)
CO2: 21 mmol/L (ref 20–29)
Calcium: 9.1 mg/dL (ref 8.7–10.2)
Chloride: 108 mmol/L — ABNORMAL HIGH (ref 96–106)
Creatinine, Ser: 0.89 mg/dL (ref 0.57–1.00)
Globulin, Total: 2.7 g/dL (ref 1.5–4.5)
Glucose: 101 mg/dL — ABNORMAL HIGH (ref 70–99)
Potassium: 3.8 mmol/L (ref 3.5–5.2)
Sodium: 144 mmol/L (ref 134–144)
Total Protein: 6.9 g/dL (ref 6.0–8.5)
eGFR: 87 mL/min/{1.73_m2} (ref >59)

## 2022-05-17 LAB — CBC
Hematocrit: 34.5 % (ref 34.0–46.6)
Hemoglobin: 11.6 g/dL (ref 11.1–15.9)
MCH: 28.4 pg (ref 26.6–33.0)
MCHC: 33.6 g/dL (ref 31.5–35.7)
MCV: 84 fL (ref 79–97)
Platelets: 213 10*3/uL (ref 150–450)
RBC: 4.09 x10E6/uL (ref 3.77–5.28)
RDW: 13.1 % (ref 11.7–15.4)
WBC: 7.2 10*3/uL (ref 3.4–10.8)

## 2022-05-17 LAB — HEMOGLOBIN A1C
Est. average glucose Bld gHb Est-mCnc: 108 mg/dL
Hgb A1c MFr Bld: 5.4 % (ref 4.8–5.6)

## 2022-05-18 ENCOUNTER — Encounter: Payer: Self-pay | Admitting: Nurse Practitioner

## 2022-05-18 DIAGNOSIS — M25512 Pain in left shoulder: Secondary | ICD-10-CM | POA: Insufficient documentation

## 2022-05-18 NOTE — Assessment & Plan Note (Signed)
-   DG Shoulder Left - DG Cervical Spine Complete  2. Neck pain  - DG Shoulder Left - DG Cervical Spine Complete  Follow up:  Follow up in  1 month or sooner if needed

## 2022-08-17 ENCOUNTER — Ambulatory Visit: Payer: BC Managed Care – PPO | Admitting: Nurse Practitioner

## 2024-03-09 IMAGING — CT CT ORBITS W/O CM
3 series · 8 of 47 positions shown, 9 images · non-contrast
Comparison: No pertinent prior exam.

CLINICAL DATA: Pain, swelling and drainage from the right eye for 2
days.

EXAM:
CT ORBITS WITHOUT CONTRAST
TECHNIQUE: Multidetector CT imaging of the orbits was performed using the
standard protocol without intravenous contrast. Multiplanar CT image
reconstructions were also generated.
RADIATION DOSE REDUCTION: This exam was performed according to the
departmental dose-optimization program which includes automated
exposure control, adjustment of the mA and/or kV according to
patient size and/or use of iterative reconstruction technique.

[Series 4: orbits 2.0 h30s st · axial · 0.34mm/px · z∈[+1580,+1612]mm · 2 of 33 slices shown, 3 images]
[im 9/33  brain]
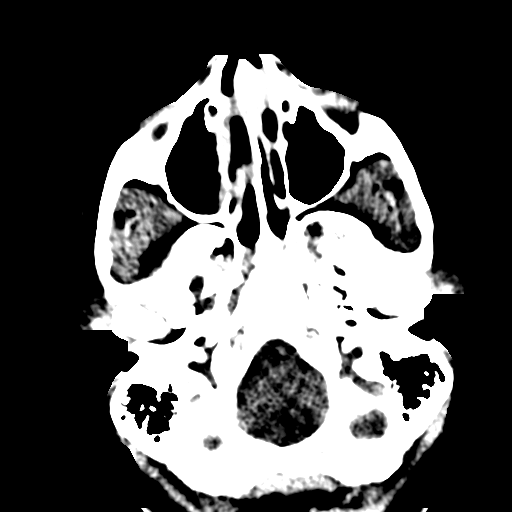
[im 9/33  bone]
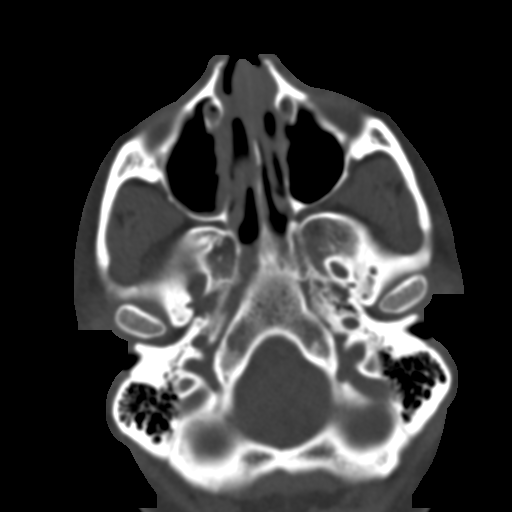
[im 25/33  bone]
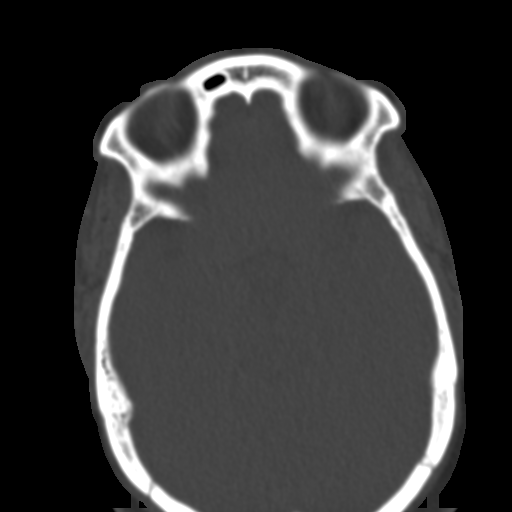

[Series 9: orbits st coronal · coronal · 0.13mm/px · 3 of 100 slices shown]
[im 34/100  bone]
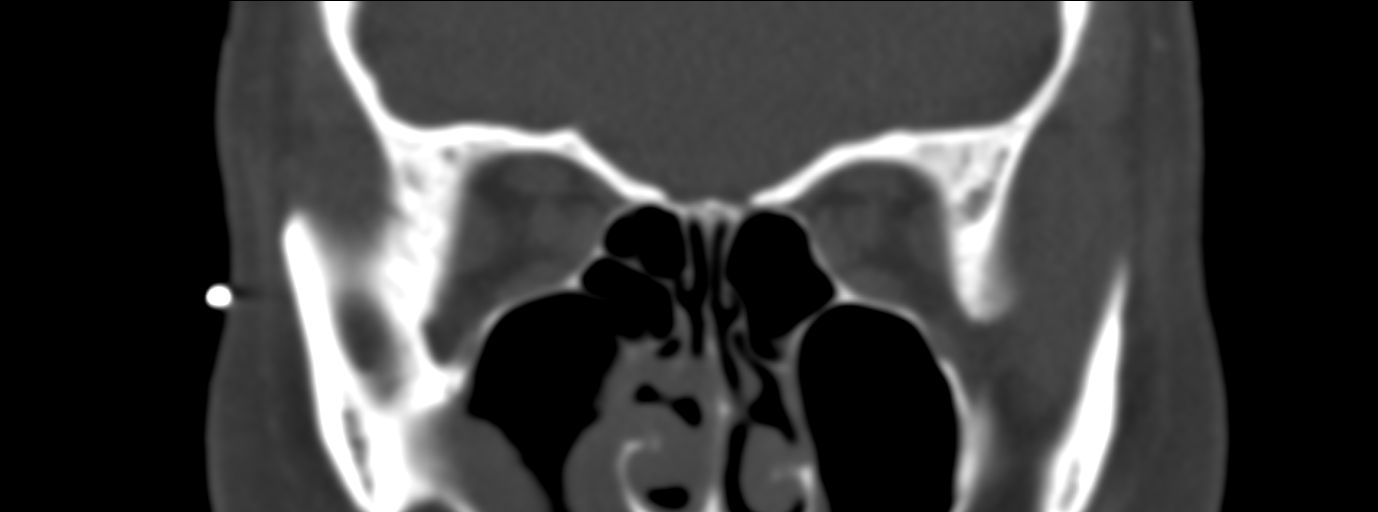
[im 45/100  bone]
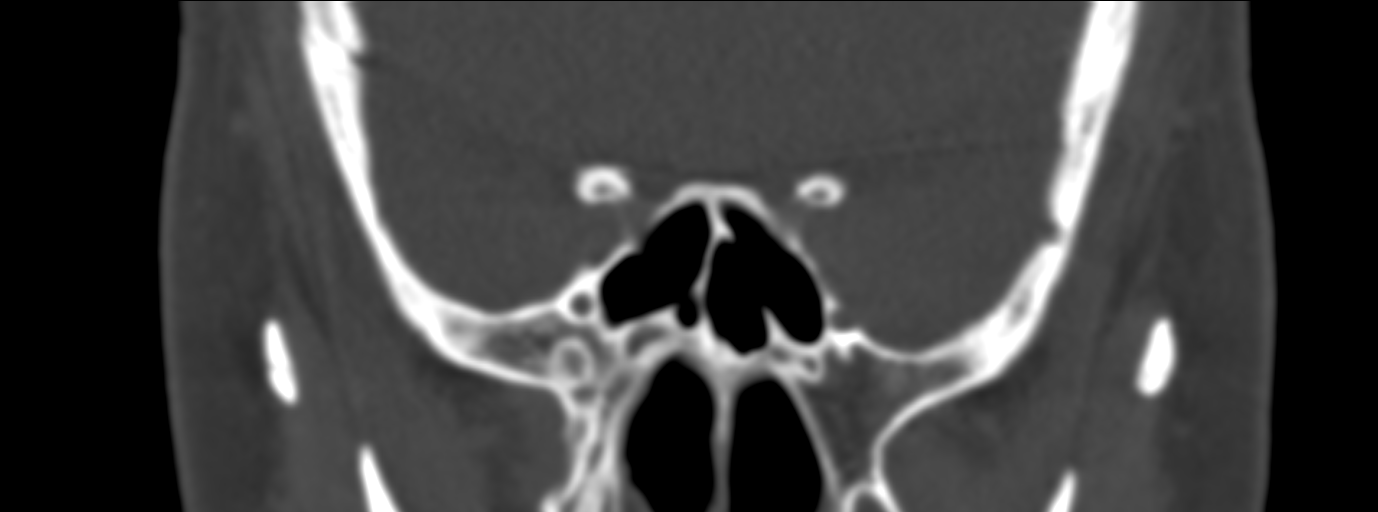
[im 56/100  bone]
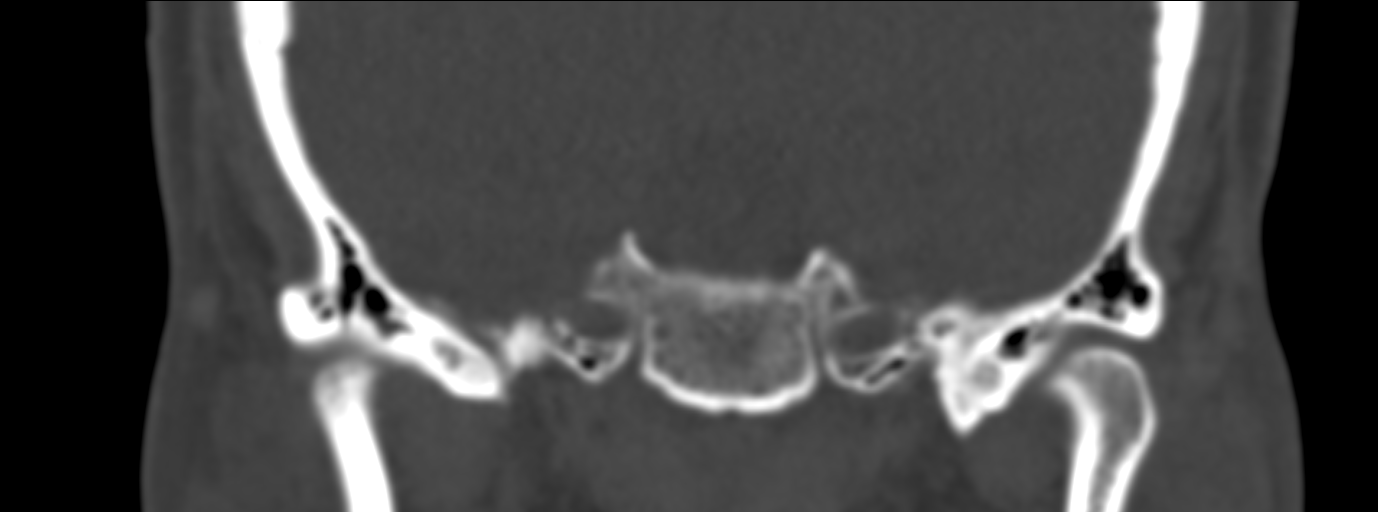

[Series 10: orbits st sagittal · sagittal · 0.13mm/px · 3 of 85 slices shown]
[im 29/85  bone]
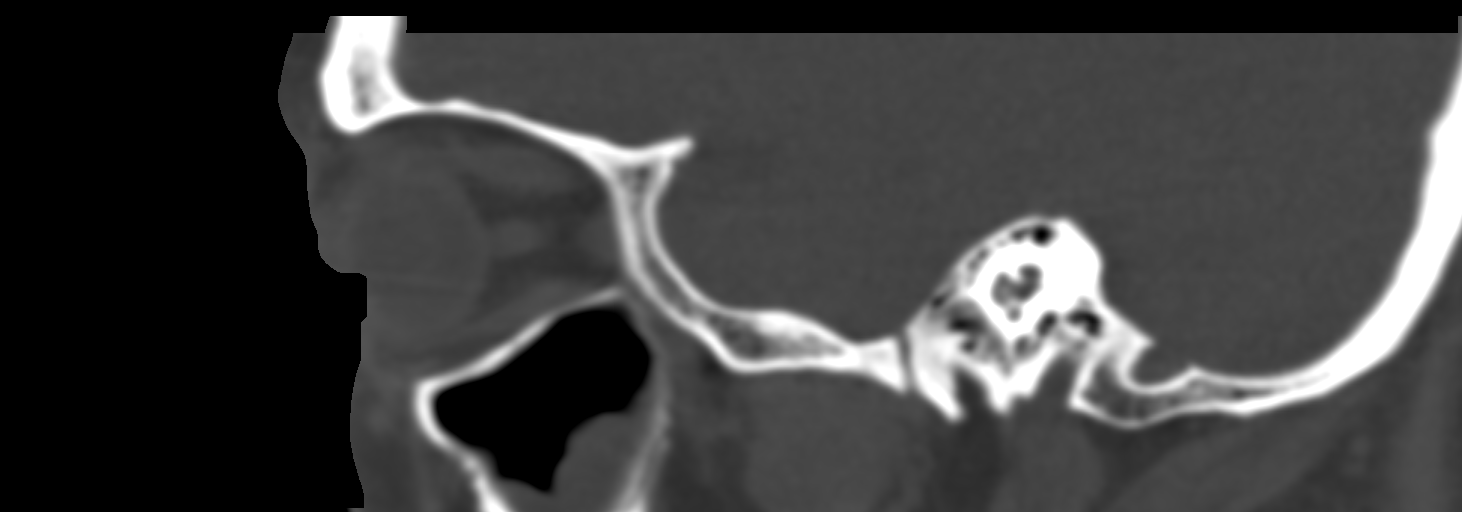
[im 43/85  bone]
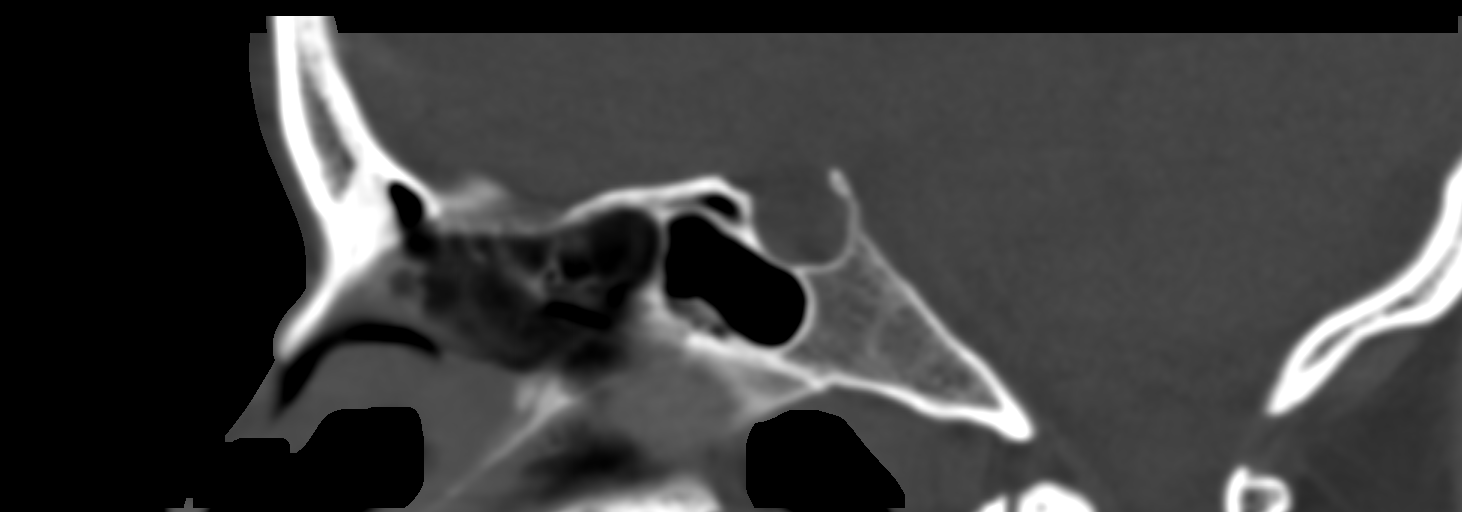
[im 57/85  bone]
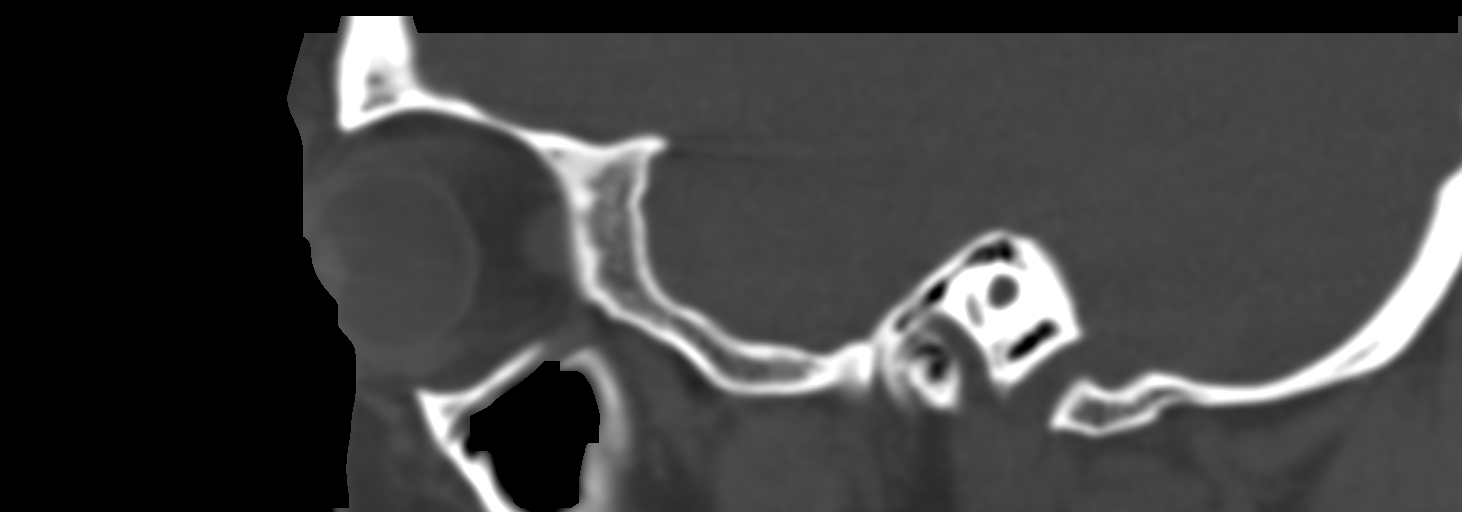

[8 of 47 positions shown; findings below may reference images not displayed]

FINDINGS: Orbits: The globes are intact. There is moderate periorbital
(preseptal) cellulitis on the right side but no intraorbital
(postseptal) involvement to suggest orbital cellulitis. The bony
orbits are intact.

Visible paranasal sinuses: Moderate diffuse mucoperiosteal
thickening involving the right maxillary sinus. Minimal
mucoperiosteal thickening involving the left maxillary sinus. The
ethmoid and sphenoid sinuses are clear. The patient does not have
formed frontal sinuses. The mastoid air cells and middle ear
cavities are clear.

Soft tissues: No soft tissue mass or abscess.

Osseous: Intact bony structures.

Limited intracranial: No significant intracranial findings.
IMPRESSION: 1. Moderate periorbital (preseptal) cellulitis on the right side but
no intraorbital (postseptal) involvement to suggest orbital
cellulitis.
2. Bilateral maxillary sinus disease, right greater than left.

## 2024-12-21 ENCOUNTER — Encounter (HOSPITAL_BASED_OUTPATIENT_CLINIC_OR_DEPARTMENT_OTHER): Payer: Self-pay

## 2024-12-21 ENCOUNTER — Emergency Department (HOSPITAL_BASED_OUTPATIENT_CLINIC_OR_DEPARTMENT_OTHER)
Admission: EM | Admit: 2024-12-21 | Discharge: 2024-12-21 | Discharge status: Against Medical Advice | Payer: Self-pay | Attending: Emergency Medicine | Admitting: Emergency Medicine

## 2024-12-21 ENCOUNTER — Emergency Department (HOSPITAL_BASED_OUTPATIENT_CLINIC_OR_DEPARTMENT_OTHER): Payer: Self-pay

## 2024-12-21 DIAGNOSIS — L03311 Cellulitis of abdominal wall: Secondary | ICD-10-CM | POA: Insufficient documentation

## 2024-12-21 DIAGNOSIS — L0390 Cellulitis, unspecified: Secondary | ICD-10-CM

## 2024-12-21 DIAGNOSIS — Z5329 Procedure and treatment not carried out because of patient's decision for other reasons: Secondary | ICD-10-CM | POA: Insufficient documentation

## 2024-12-21 LAB — URINALYSIS, ROUTINE W REFLEX MICROSCOPIC
Bilirubin Urine: NEGATIVE
Glucose, UA: NEGATIVE mg/dL
Hgb urine dipstick: NEGATIVE
Ketones, ur: NEGATIVE mg/dL
Nitrite: NEGATIVE
Protein, ur: NEGATIVE mg/dL
Specific Gravity, Urine: 1.025 (ref 1.005–1.030)
pH: 5.5 (ref 5.0–8.0)

## 2024-12-21 LAB — COMPREHENSIVE METABOLIC PANEL WITH GFR
ALT: 10 U/L (ref 0–44)
AST: 15 U/L (ref 15–41)
Albumin: 4.1 g/dL (ref 3.5–5.0)
Alkaline Phosphatase: 78 U/L (ref 38–126)
Anion gap: 9 (ref 5–15)
BUN: 12 mg/dL (ref 6–20)
CO2: 26 mmol/L (ref 22–32)
Calcium: 8.7 mg/dL — ABNORMAL LOW (ref 8.9–10.3)
Chloride: 104 mmol/L (ref 98–111)
Creatinine, Ser: 0.92 mg/dL (ref 0.44–1.00)
GFR, Estimated: >60 mL/min
Glucose, Bld: 94 mg/dL (ref 70–99)
Potassium: 3.5 mmol/L (ref 3.5–5.1)
Sodium: 139 mmol/L (ref 135–145)
Total Bilirubin: 0.3 mg/dL (ref 0.0–1.2)
Total Protein: 7 g/dL (ref 6.5–8.1)

## 2024-12-21 LAB — CBC WITH DIFFERENTIAL/PLATELET
Abs Immature Granulocytes: 0.01 10*3/uL (ref 0.00–0.07)
Basophils Absolute: 0.1 10*3/uL (ref 0.0–0.1)
Basophils Relative: 1 %
Eosinophils Absolute: 0.2 10*3/uL (ref 0.0–0.5)
Eosinophils Relative: 2 %
HCT: 32.9 % — ABNORMAL LOW (ref 36.0–46.0)
Hemoglobin: 11.1 g/dL — ABNORMAL LOW (ref 12.0–15.0)
Immature Granulocytes: 0 %
Lymphocytes Relative: 33 %
Lymphs Abs: 2.6 10*3/uL (ref 0.7–4.0)
MCH: 27.8 pg (ref 26.0–34.0)
MCHC: 33.7 g/dL (ref 30.0–36.0)
MCV: 82.3 fL (ref 80.0–100.0)
Monocytes Absolute: 0.5 10*3/uL (ref 0.1–1.0)
Monocytes Relative: 6 %
Neutro Abs: 4.5 10*3/uL (ref 1.7–7.7)
Neutrophils Relative %: 58 %
Platelets: 235 10*3/uL (ref 150–400)
RBC: 4 MIL/uL (ref 3.87–5.11)
RDW: 13.2 % (ref 11.5–15.5)
WBC: 7.8 10*3/uL (ref 4.0–10.5)
nRBC: 0 % (ref 0.0–0.2)

## 2024-12-21 LAB — URINALYSIS, MICROSCOPIC (REFLEX)

## 2024-12-21 LAB — HCG, SERUM, QUALITATIVE: Preg, Serum: NEGATIVE

## 2024-12-21 MED ORDER — KETOROLAC TROMETHAMINE 15 MG/ML IJ SOLN
30.0000 mg | Freq: Once | INTRAMUSCULAR | Status: AC
  Administered 2024-12-21: 30 mg via INTRAVENOUS
  Filled 2024-12-21: qty 2

## 2024-12-21 MED ORDER — CELECOXIB 200 MG PO CAPS: 200.0000 mg | ORAL_CAPSULE | Freq: Two times a day (BID) | ORAL | 0 refills | Status: AC

## 2024-12-21 MED ORDER — ACETAMINOPHEN 500 MG PO TABS
1000.0000 mg | ORAL_TABLET | Freq: Once | ORAL | Status: AC
  Administered 2024-12-21: 1000 mg via ORAL
  Filled 2024-12-21: qty 2

## 2024-12-21 MED ORDER — CEPHALEXIN 250 MG PO CAPS
500.0000 mg | ORAL_CAPSULE | Freq: Once | ORAL | Status: AC
  Administered 2024-12-21: 500 mg via ORAL
  Filled 2024-12-21: qty 2

## 2024-12-21 MED ORDER — MORPHINE SULFATE (PF) 4 MG/ML IV SOLN
4.0000 mg | Freq: Once | INTRAVENOUS | Status: AC
  Administered 2024-12-21: 4 mg via INTRAVENOUS
  Filled 2024-12-21: qty 1

## 2024-12-21 MED ORDER — IOHEXOL 300 MG/ML  SOLN
100.0000 mL | Freq: Once | INTRAMUSCULAR | Status: AC | PRN
  Administered 2024-12-21: 100 mL via INTRAVENOUS

## 2024-12-21 MED ORDER — ACETAMINOPHEN 500 MG PO TABS: 1000.0000 mg | ORAL_TABLET | Freq: Three times a day (TID) | ORAL | 0 refills | Status: AC | PRN

## 2024-12-21 MED ORDER — CEPHALEXIN 500 MG PO CAPS: 500.0000 mg | ORAL_CAPSULE | Freq: Three times a day (TID) | ORAL | 0 refills | Status: AC

## 2024-12-21 NOTE — ED Provider Notes (Signed)
 Care was can over from Dr. Jerral.  Patient with prior infections in her lower abdomen presents with similar symptoms.  No fever or concerns for sepsis.  She was awaiting results of a CT scan.  She did not want to wait any longer and left AMA prior to the scan results.  After she left, I did get the CT results which does not show any evidence of abscess.  There is dislodge clips from prior BTL noted in the abdomen.  This appears unchanged from prior scans.  I reviewed her records and it looks like she had a scan in 2020 that showed this dislodged clip.  She was advised that this per the EDP notes and advised that she needed to use backup protection and follow-up with her OB/GYN.    She had been given prescriptions for antibiotics and Celebrex  per Dr. Jerral.   Lenor Hollering, MD 12/21/24 [phone]

## 2024-12-21 NOTE — ED Notes (Signed)
 Pt voiced that she was ready to go. Pt pulled her own IV Out. Pt made aware of AMA protocol.

## 2024-12-21 NOTE — ED Triage Notes (Signed)
 POV, pt complaints of back pain for five days  Today patient noticed secretions on the lower anterior abdominal wall/ pubic area. Where pt had a C-section 16 yeas ago. Area does looks red and has foul smelling secretions.  Back pain 10 out 10, irradiates on left side, and irritation is significant on anterior pubic area. Alert and oriented x4 denies fevers at home.

## 2024-12-26 ENCOUNTER — Telehealth: Payer: Self-pay

## 2024-12-26 NOTE — Transitions of Care (Post Inpatient/ED Visit) (Signed)
" ° °  12/26/2024  Name: Destiny Cook MRN: [id number] DOB: [birth year]  Today's TOC FU Call Status: Today's TOC FU Call Status:: Unsuccessful Call (1st Attempt) Unsuccessful Call (1st Attempt) Date: 12/26/24  Attempted to reach the patient regarding the most recent Inpatient/ED visit.  Follow Up Plan: Additional outreach attempts will be made to reach the patient to complete the Transitions of Care (Post Inpatient/ED visit) call.   Signature Suzen Shove   CMA II  "
